# Patient Record
Sex: Female | Born: 1955
Health system: Southern US, Community
[De-identification: ages and names within clinical notes are randomized; demographics above are authoritative.]

## PROBLEM LIST (undated history)

## (undated) DIAGNOSIS — R7303 Prediabetes: Secondary | ICD-10-CM

## (undated) DIAGNOSIS — E7800 Pure hypercholesterolemia, unspecified: Secondary | ICD-10-CM

---

## 2006-03-15 ENCOUNTER — Other Ambulatory Visit: Admission: RE | Admit: 2006-03-15 | Discharge: 2006-03-15 | Payer: Self-pay | Admitting: Gynecology

## 2006-03-20 ENCOUNTER — Ambulatory Visit (HOSPITAL_COMMUNITY): Admission: RE | Admit: 2006-03-20 | Discharge: 2006-03-20 | Payer: Self-pay | Admitting: Gynecology

## 2006-03-30 ENCOUNTER — Encounter: Admission: RE | Admit: 2006-03-30 | Discharge: 2006-03-30 | Payer: Self-pay | Admitting: Gynecology

## 2006-10-03 ENCOUNTER — Encounter: Admission: RE | Admit: 2006-10-03 | Discharge: 2006-10-03 | Payer: Self-pay | Admitting: Family Medicine

## 2007-01-09 ENCOUNTER — Ambulatory Visit (HOSPITAL_BASED_OUTPATIENT_CLINIC_OR_DEPARTMENT_OTHER): Admission: RE | Admit: 2007-01-09 | Discharge: 2007-01-09 | Payer: Self-pay | Admitting: Urology

## 2007-11-07 ENCOUNTER — Ambulatory Visit: Payer: Self-pay

## 2009-02-19 ENCOUNTER — Ambulatory Visit: Payer: Self-pay | Admitting: Family Medicine

## 2009-03-05 ENCOUNTER — Ambulatory Visit: Payer: Self-pay | Admitting: Family Medicine

## 2009-09-07 ENCOUNTER — Emergency Department: Payer: Self-pay | Admitting: Emergency Medicine

## 2010-02-24 IMAGING — MG MAM DGTL ADD VW LT  SCR
2 series · 8 of 8 positions shown · non-contrast
Comparison: none

REASON FOR EXAM: left breast nodular density
COMMENTS:

[L ML · left · 4 of 4 slices shown]
[im 1/4]
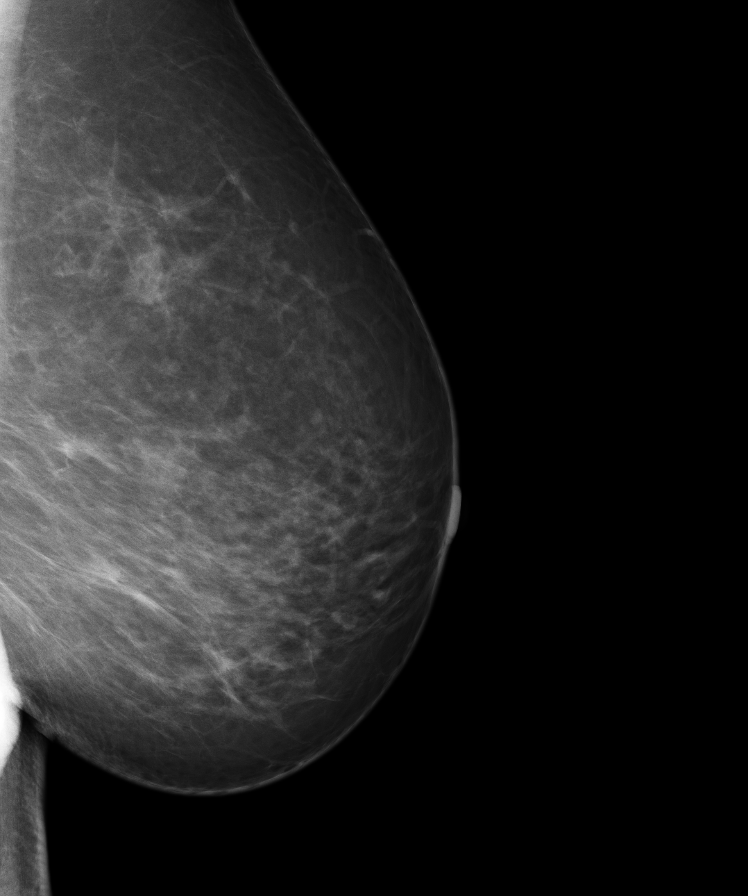
[im 2/4]
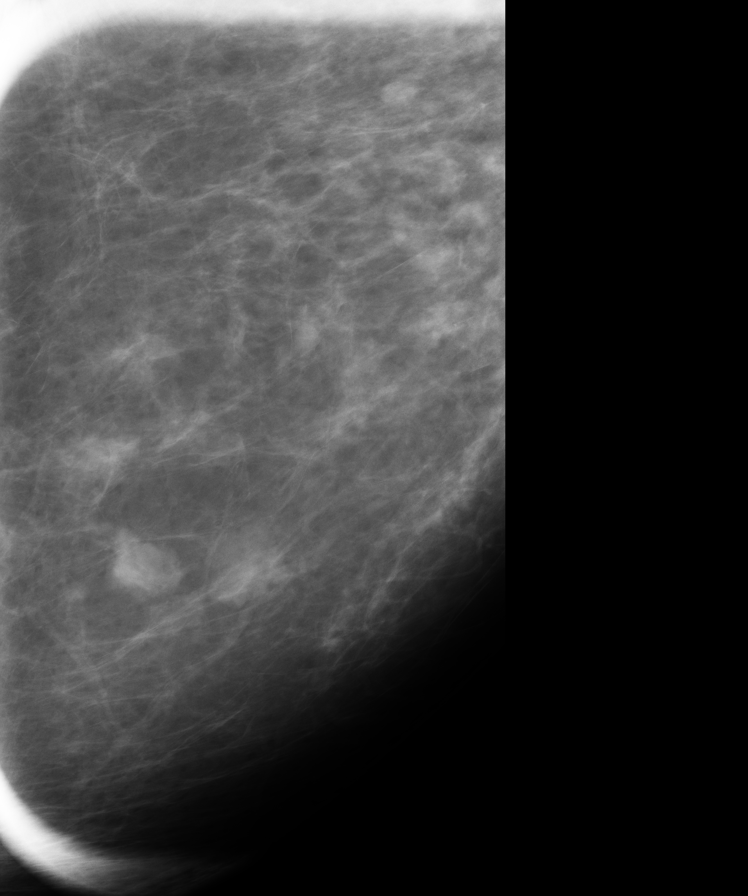
[im 3/4]
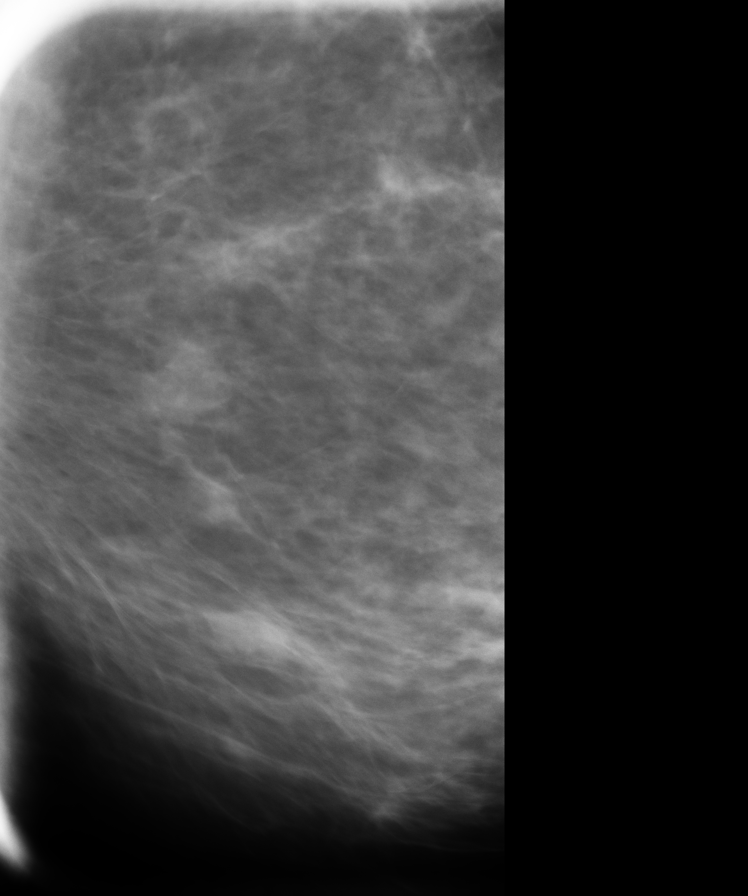
[im 4/4]
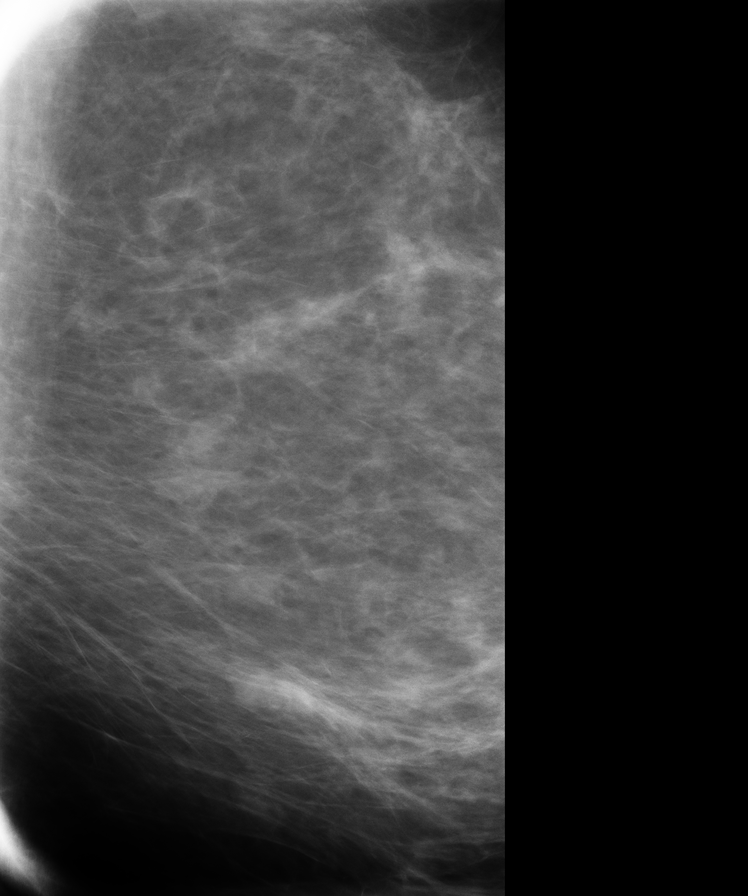

[L CC · left · 4 of 4 slices shown]
[im 1/4]
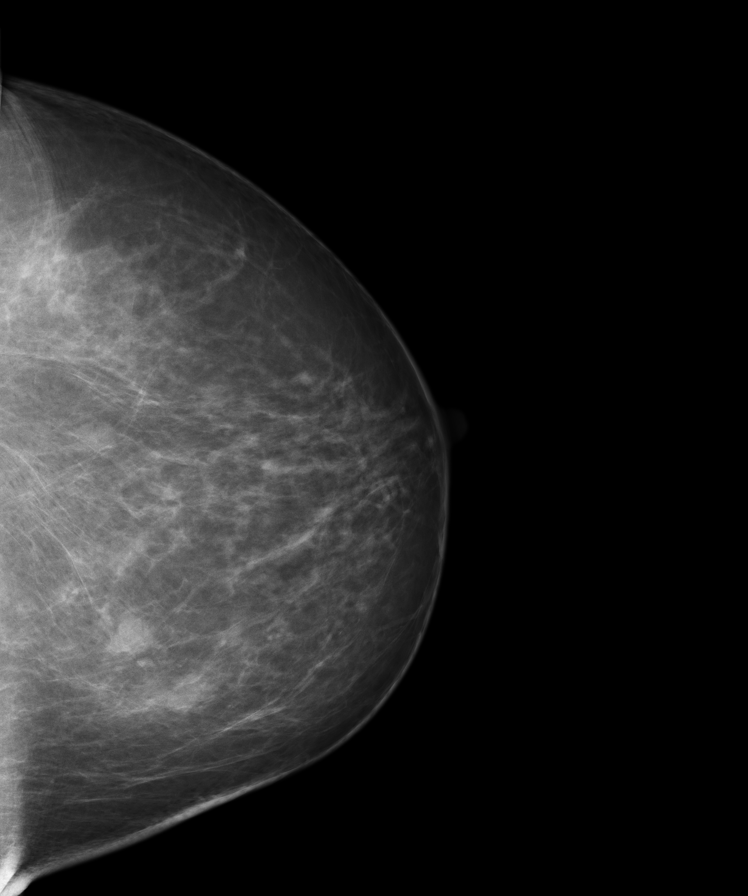
[im 2/4]
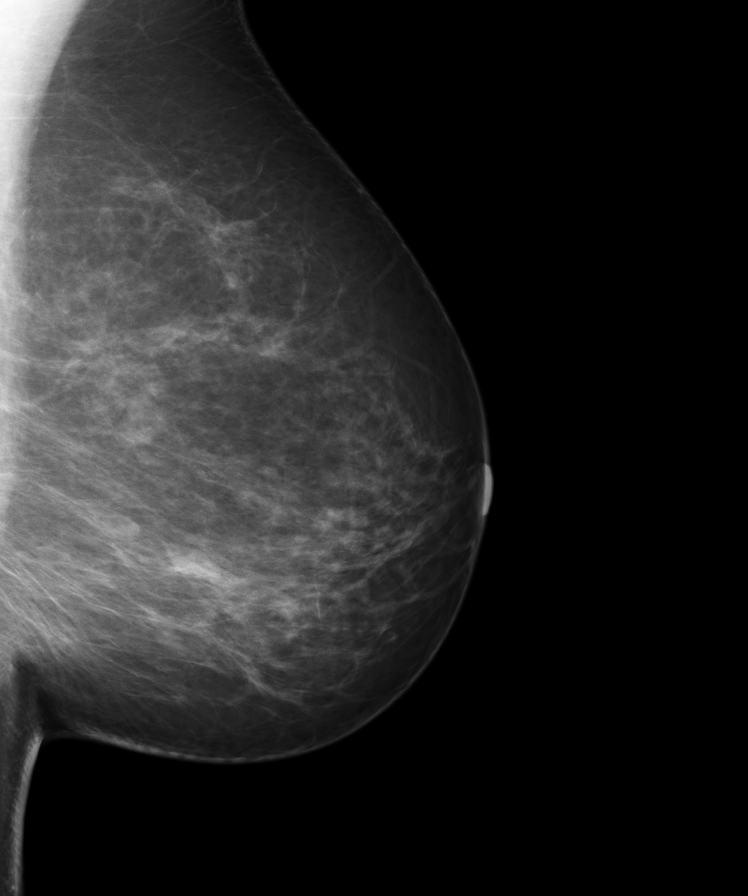
[im 3/4]
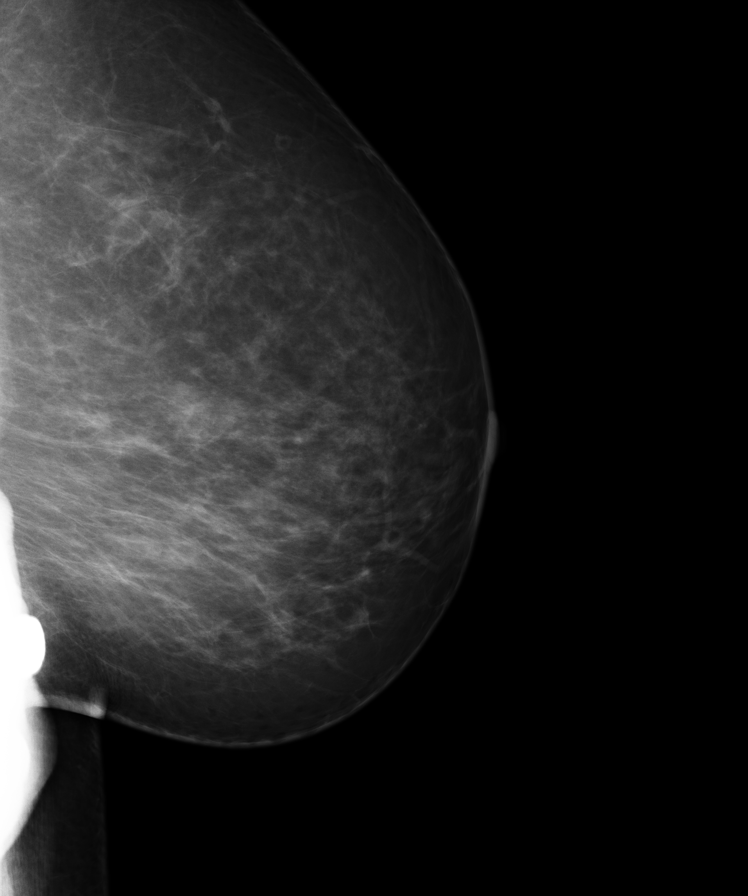
[im 4/4]
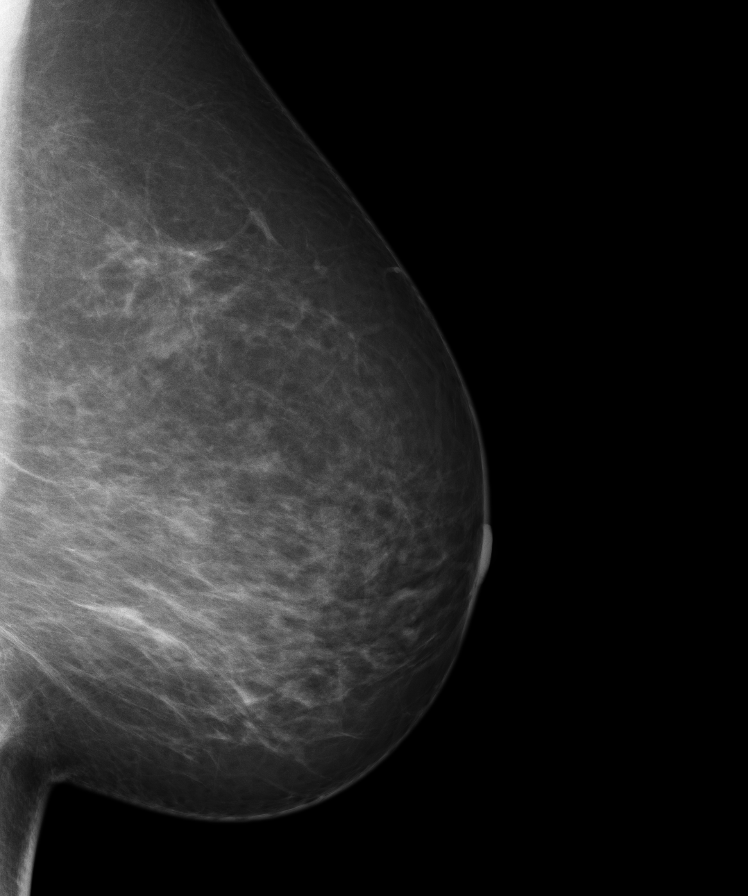

[8 of 8 positions shown; findings below may reference images not displayed]

PROCEDURE:     MAM - MAM DGTL ADD VW LT  SCR  - March 05, 2009  [DATE]

RESULT:     The asymmetric density observed in the medial aspect of the left
breast tends to spread on the compression views and is not definitely
identified in the ML view.  Rotated CC views and a repeat MLO view also show
the finding to be less prominent. No actual mass is identified. To document
stability it is recommended that the patient have a follow-up left mammogram
in six months and if no additional change is seen, then bilateral mammogram
or screening mammography at one year.
IMPRESSION: 1.Multiple additional views of the left breast were obtained. No mass is
identified at the site of the asymmetric density noted on the mammogram of
02-19-09.

2.It is recommended the patient have a follow-up left mammogram in six
months.

BI-RADS: Category 3 - Probably Benign Finding (interval follow-up)

Probably benign but with six months follow-up recommended.

A NEGATIVE MAMMOGRAM REPORT DOES NOT PRECLUDE BIOPSY OR OTHER EVALUATION OF
A CLINICALLY PALPABLE OR OTHERWISE SUSPICIOUS MASS OR LESION. BREAST CANCER
MAY NOT BE DETECTED BY MAMMOGRAPHY IN UP TO 10% OF CASES.

## 2011-10-10 ENCOUNTER — Ambulatory Visit: Payer: Self-pay | Admitting: Physician Assistant

## 2012-12-31 ENCOUNTER — Ambulatory Visit: Payer: Self-pay | Admitting: Physician Assistant

## 2014-01-06 ENCOUNTER — Ambulatory Visit: Payer: Self-pay | Admitting: Specialist

## 2014-01-22 ENCOUNTER — Encounter: Payer: Self-pay | Admitting: Specialist

## 2014-02-02 ENCOUNTER — Encounter: Payer: Self-pay | Admitting: Specialist

## 2014-03-04 ENCOUNTER — Encounter: Payer: Self-pay | Admitting: Specialist

## 2014-03-18 ENCOUNTER — Ambulatory Visit: Payer: Self-pay | Admitting: Physician Assistant

## 2014-03-19 ENCOUNTER — Ambulatory Visit: Payer: Self-pay | Admitting: Physician Assistant

## 2014-04-02 ENCOUNTER — Ambulatory Visit: Payer: Self-pay | Admitting: Physician Assistant

## 2014-04-02 HISTORY — PX: BREAST BIOPSY: SHX20

## 2014-04-03 LAB — PATHOLOGY REPORT

## 2014-11-25 DIAGNOSIS — R001 Bradycardia, unspecified: Secondary | ICD-10-CM | POA: Insufficient documentation

## 2014-11-25 DIAGNOSIS — R0789 Other chest pain: Secondary | ICD-10-CM | POA: Insufficient documentation

## 2014-11-25 DIAGNOSIS — Z87898 Personal history of other specified conditions: Secondary | ICD-10-CM | POA: Insufficient documentation

## 2014-12-04 HISTORY — PX: OTHER SURGICAL HISTORY: SHX169

## 2014-12-10 ENCOUNTER — Ambulatory Visit
Admit: 2014-12-10 | Disposition: A | Discharge status: Home / Self Care | Payer: Self-pay | Attending: Physician Assistant | Admitting: Physician Assistant

## 2015-12-29 ENCOUNTER — Ambulatory Visit: Payer: Self-pay | Admitting: Sports Medicine

## 2016-01-14 ENCOUNTER — Encounter: Payer: Self-pay | Admitting: Sports Medicine

## 2016-01-14 ENCOUNTER — Ambulatory Visit (INDEPENDENT_AMBULATORY_CARE_PROVIDER_SITE_OTHER): Payer: BLUE CROSS/BLUE SHIELD

## 2016-01-14 ENCOUNTER — Ambulatory Visit (INDEPENDENT_AMBULATORY_CARE_PROVIDER_SITE_OTHER): Payer: BLUE CROSS/BLUE SHIELD | Admitting: Sports Medicine

## 2016-01-14 DIAGNOSIS — M79673 Pain in unspecified foot: Secondary | ICD-10-CM

## 2016-01-14 DIAGNOSIS — M722 Plantar fascial fibromatosis: Secondary | ICD-10-CM | POA: Diagnosis not present

## 2016-01-14 NOTE — Patient Instructions (Signed)
Plantar Fasciitis With Rehab The plantar fascia is a fibrous, ligament-like, soft-tissue structure that spans the bottom of the foot. Plantar fasciitis, also called heel spur syndrome, is a condition that causes pain in the foot due to inflammation of the tissue. SYMPTOMS   Pain and tenderness on the underneath side of the foot.  Pain that worsens with standing or walking. CAUSES  Plantar fasciitis is caused by irritation and injury to the plantar fascia on the underneath side of the foot. Common mechanisms of injury include:  Direct trauma to bottom of the foot.  Damage to a small nerve that runs under the foot where the main fascia attaches to the heel bone.  Stress placed on the plantar fascia due to bone spurs. RISK INCREASES WITH:   Activities that place stress on the plantar fascia (running, jumping, pivoting, or cutting).  Poor strength and flexibility.  Improperly fitted shoes.  Tight calf muscles.  Flat feet.  Failure to warm-up properly before activity.  Obesity. PREVENTION  Warm up and stretch properly before activity.  Allow for adequate recovery between workouts.  Maintain physical fitness:  Strength, flexibility, and endurance.  Cardiovascular fitness.  Maintain a health body weight.  Avoid stress on the plantar fascia.  Wear properly fitted shoes, including arch supports for individuals who have flat feet. PROGNOSIS  If treated properly, then the symptoms of plantar fasciitis usually resolve without surgery. However, occasionally surgery is necessary. RELATED COMPLICATIONS   Recurrent symptoms that may result in a chronic condition.  Problems of the lower back that are caused by compensating for the injury, such as limping.  Pain or weakness of the foot during push-off following surgery.  Chronic inflammation, scarring, and partial or complete fascia tear, occurring more often from repeated injections. TREATMENT  Treatment initially involves  the use of ice and medication to help reduce pain and inflammation. The use of strengthening and stretching exercises may help reduce pain with activity, especially stretches of the Achilles tendon. These exercises may be performed at home or with a therapist. Your caregiver may recommend that you use heel cups of arch supports to help reduce stress on the plantar fascia. Occasionally, corticosteroid injections are given to reduce inflammation. If symptoms persist for greater than 6 months despite non-surgical (conservative), then surgery may be recommended.  MEDICATION   If pain medication is necessary, then nonsteroidal anti-inflammatory medications, such as aspirin and ibuprofen, or other minor pain relievers, such as acetaminophen, are often recommended.  Do not take pain medication within 7 days before surgery.  Prescription pain relievers may be given if deemed necessary by your caregiver. Use only as directed and only as much as you need.  Corticosteroid injections may be given by your caregiver. These injections should be reserved for the most serious cases, because they may only be given a certain number of times. HEAT AND COLD  Cold treatment (icing) relieves pain and reduces inflammation. Cold treatment should be applied for 10 to 15 minutes every 2 to 3 hours for inflammation and pain and immediately after any activity that aggravates your symptoms. Use ice packs or massage the area with a piece of ice (ice massage).  Heat treatment may be used prior to performing the stretching and strengthening activities prescribed by your caregiver, physical therapist, or athletic trainer. Use a heat pack or soak the injury in warm water. SEEK IMMEDIATE MEDICAL CARE IF:  Treatment seems to offer no benefit, or the condition worsens.  Any medications produce adverse side effects.   EXERCISES RANGE OF MOTION (ROM) AND STRETCHING EXERCISES - Plantar Fasciitis (Heel Spur Syndrome) These exercises may  help you when beginning to rehabilitate your injury. Your symptoms may resolve with or without further involvement from your physician, physical therapist or athletic trainer. While completing these exercises, remember:   Restoring tissue flexibility helps normal motion to return to the joints. This allows healthier, less painful movement and activity.  An effective stretch should be held for at least 30 seconds.  A stretch should never be painful. You should only feel a gentle lengthening or release in the stretched tissue. RANGE OF MOTION - Toe Extension, Flexion  Sit with your right / left leg crossed over your opposite knee.  Grasp your toes and gently pull them back toward the top of your foot. You should feel a stretch on the bottom of your toes and/or foot.  Hold this stretch for __________ seconds.  Now, gently pull your toes toward the bottom of your foot. You should feel a stretch on the top of your toes and or foot.  Hold this stretch for __________ seconds. Repeat __________ times. Complete this stretch __________ times per day.  RANGE OF MOTION - Ankle Dorsiflexion, Active Assisted  Remove shoes and sit on a chair that is preferably not on a carpeted surface.  Place right / left foot under knee. Extend your opposite leg for support.  Keeping your heel down, slide your right / left foot back toward the chair until you feel a stretch at your ankle or calf. If you do not feel a stretch, slide your bottom forward to the edge of the chair, while still keeping your heel down.  Hold this stretch for __________ seconds. Repeat __________ times. Complete this stretch __________ times per day.  STRETCH - Gastroc, Standing  Place hands on wall.  Extend right / left leg, keeping the front knee somewhat bent.  Slightly point your toes inward on your back foot.  Keeping your right / left heel on the floor and your knee straight, shift your weight toward the wall, not allowing your  back to arch.  You should feel a gentle stretch in the right / left calf. Hold this position for __________ seconds. Repeat __________ times. Complete this stretch __________ times per day. STRETCH - Soleus, Standing  Place hands on wall.  Extend right / left leg, keeping the other knee somewhat bent.  Slightly point your toes inward on your back foot.  Keep your right / left heel on the floor, bend your back knee, and slightly shift your weight over the back leg so that you feel a gentle stretch deep in your back calf.  Hold this position for __________ seconds. Repeat __________ times. Complete this stretch __________ times per day. STRETCH - Gastrocsoleus, Standing  Note: This exercise can place a lot of stress on your foot and ankle. Please complete this exercise only if specifically instructed by your caregiver.   Place the ball of your right / left foot on a step, keeping your other foot firmly on the same step.  Hold on to the wall or a rail for balance.  Slowly lift your other foot, allowing your body weight to press your heel down over the edge of the step.  You should feel a stretch in your right / left calf.  Hold this position for __________ seconds.  Repeat this exercise with a slight bend in your right / left knee. Repeat __________ times. Complete this stretch __________ times   per day.  STRENGTHENING EXERCISES - Plantar Fasciitis (Heel Spur Syndrome)  These exercises may help you when beginning to rehabilitate your injury. They may resolve your symptoms with or without further involvement from your physician, physical therapist or athletic trainer. While completing these exercises, remember:   Muscles can gain both the endurance and the strength needed for everyday activities through controlled exercises.  Complete these exercises as instructed by your physician, physical therapist or athletic trainer. Progress the resistance and repetitions only as  guided. STRENGTH - Towel Curls  Sit in a chair positioned on a non-carpeted surface.  Place your foot on a towel, keeping your heel on the floor.  Pull the towel toward your heel by only curling your toes. Keep your heel on the floor.  If instructed by your physician, physical therapist or athletic trainer, add ____________________ at the end of the towel. Repeat __________ times. Complete this exercise __________ times per day. STRENGTH - Ankle Inversion  Secure one end of a rubber exercise band/tubing to a fixed object (table, pole). Loop the other end around your foot just before your toes.  Place your fists between your knees. This will focus your strengthening at your ankle.  Slowly, pull your big toe up and in, making sure the band/tubing is positioned to resist the entire motion.  Hold this position for __________ seconds.  Have your muscles resist the band/tubing as it slowly pulls your foot back to the starting position. Repeat __________ times. Complete this exercises __________ times per day.    This information is not intended to replace advice given to you by your health care provider. Make sure you discuss any questions you have with your health care provider.   Document Released: 08/21/2005 Document Revised: 01/05/2015 Document Reviewed: 12/03/2008 Elsevier Interactive Patient Education 2016 Elsevier Inc.  Fascitis plantar con rehabilitacin (Plantar Fasciitis With Rehab) La fascia plantar es una estructura fibrosa, tipo ligamento, de tejido blando que abarca la parte inferior del pie. La fascitis plantar es una enfermedad que ocasiona dolor en el pie debido a la inflamacin del tejido. SNTOMAS  Dolor y sensibilidad en la planta del pie.  Dolor especialmente al ponerse de pie o caminar. CAUSAS La fascitis plantar est causada por irritacin y lesin en la fascia plantar debajo del pie. Los mecanismos ms frecuentes de una lesin son:  Golpe directo en la  planta del pie.  Dao a un pequeo nervio que [employer], Mudlogger en que la fascia principal se une al hueso del taln.  Estrs aplicado en la fascia plantar debido a espolones seos. EL RIESGO AUMENTA CON:   Actividades que estresan la fascia plantar (correr, saltar, pivotar o cortar).  Poca fuerza y flexibilidad.  Calzado mal ajustado.  Msculos de la pantorrilla tensos.  Pie plano.  No hacer un precalentamiento adecuado.  Obesidad. PREVENCIN  Precalentamiento adecuado y elongacin antes de la [place].  Descanso y recuperacin entre actividades.  Mantener la forma fsica:  Kerry Hough, flexibilidad y resistencia muscular.  Capacidad cardiovascular.  Mantenga un peso corporal adecuado.  Evite el estrs en la fascia plantar.  Para deportistas con pie plano, utilizacin de plantillas anatmicas para los arcos. PRONSTICO Si se trata adecuadamente, generalmente es curable sin [place]. En ocasiones requiere someterse a [place]. POSIBLES COMPLICACIONES  La recurrencia frecuente de los sntomas puede dar como resultado un problema crnico.  Problemas en la cintura causados para compensar la lesin, como renguera.  Dolor o debilidad en el pie al Passenger transport manager  pie luego de la [place].  Inflamacin crnica, cicatrizacin y ruptura parcial o completa de la fascia, que se produce luego de repetidas inyecciones. TRATAMIENTO El tratamiento inicial incluye el uso de medicamentos y la aplicacin de hielo para reducir Conservation officer, historic buildings y la inflamacin. Los ejercicios de elongacin y fortalecimiento pueden ayudar a reducir Conservation officer, historic buildings con la actividad, en especial los dirigidos al tendn de [place]. Los ejercicios pueden Press photographer o con un terapeuta. El [employer] podr recomendar que utilice tacos o soportes para el arco para ayudar a Software engineer de la fascia plantar. En algunos casos se indica una inyeccin de corticoides para reducir la inflamacin. Si los  sntomas persisten por ms de 6 meses de tratamiento no quirrgico (conservador), se Biochemist, clinical.  MEDICAMENTOS  Si necesita analgsicos, se recomiendan los antiinflamatorios no esteroides, como aspirina e ibuprofeno y otros calmantes menores, como acetaminofeno  No tome medicamentos para Conservation officer, historic buildings dentro de los 7 das previos a la [place].  Los analgsicos prescriptos se indicarn si el mdico lo considera necesario. Utilcelos como se le indique y slo cuando lo necesite.  En algunos casos se indica una inyeccin de corticosteroides. Estas inyecciones deben reservarse para los casos graves, porque slo se pueden administrar una determinada cantidad de veces. CALOR Y FRO  El tratamiento con fro [employer] y reduce la inflamacin. El fro debe aplicarse durante 10 a 15 minutos cada 2  3 horas para reducir la inflamacin y Conservation officer, historic buildings e inmediatamente despus de cualquier actividad que agrava los sntomas. Utilice bolsas de hielo o masajee la zona con un trozo de hielo (masaje de hielo).  El calor puede usarse antes de Neurosurgeon y [place] fortalecimiento indicadas por el profesional, le fisioterapeuta o Industrial/product designer. Utilice una bolsa trmica o sumerja la lesin en agua caliente. SOLICITE ATENCIN MDICA DE INMEDIATO SI:  El tratamiento no lo beneficia, o el trastorno empeora.  Los medicamentos producen efectos secundarios. EJERCICIOS EJERCICIOS DE AMPLITUD DE MOVIMIENTOS Y ELONGACIN - Fascitis plantar (sndrome del espoln en el taln) Estos ejercicios le ayudarn en la recuperacin de la lesin. Los sntomas podrn aliviarse con o sin asistencia adicional de su mdico, fisioterapeuta o Administrator, sports. Al completar estos ejercicios, recuerde:   Restaurar la flexibilidad del tejido ayuda a que las articulaciones recuperen el movimiento normal. Esto permite que el movimiento y la actividad sea ms saludables y menos dolorosos.  Para que sea efectiva, cada elongacin debe  realizarse durante al menos 30 segundos.  La elongacin nunca debe ser dolorosa. Deber sentir slo un alargamiento o distensin suave del tejido que estira. AMPLITUD DE MOVIMIENTOS - Extensin de los dedos - flexin  Sintese con la pierna derecha / izquierdo cruzada sobre la rodilla opuesta.  [employer] dedos de los pies y empjelos hacia usted. Debe sentir un estiramiento suave en la zona inferior de los dedos y del pie.  Mantenga esta posicin durante __________ segundos.  Luego, tome los dedos de los pies y empjelos [place]. Debe sentir un estiramiento suave en la zona superior de los dedos y del pie.  Mantenga esta posicin durante __________ segundos. Reptalo __________ veces. Realice este estiramiento __________ Vicenta Aly por da.  AMPLITUD DE MOVIMIENTOS - Dorsiflexin del tobillo - Rosealee Albee, asistida  Qutese los zapatos y sintese en una silla, preferiblemente en una superficie sin alfombra.  Coloque el pie derecha / izquierdo debajo de la rodilla. Extienda la pierna contraria para estar apoyado.  Con el taln [employer]  abajo, deslice el pie derecha / izquierdo hacia la silla hasta que sienta un estiramiento en el tobillo o pantorrilla. Si no lo siente, deslice la cadera hacia adelante [employer] borde de la silla, manteniendo el taln [place].  Mantenga esta posicin durante __________ segundos. Reptalo __________ veces. Realice este estiramiento __________ Vicenta Aly por da.  [place] manos en la pared.  Extienda la pierna derecha / izquierdo y Quarry manager la rodilla levemente flexionada.  Apunte los dedos ligeramente hacia adentro con el pie de atrs.  Mantenga el taln derecha / izquierdo en el suelo y la rodilla recta, cambie el peso hacia la pared y no permita que la espalda se arquee.  Debe sentir un estiramiento en la pantorrila derecha / izquierdo. Mantenga esta posicicin durante __________ segundos. Reptalo __________ veces. Realice  este estiramiento __________ Vicenta Aly por da. Tensed manos en la pared.  Extienda la pierna derecha / izquierdo y Quarry manager la rodilla levemente flexionada.  Apunte los dedos ligeramente hacia adentro con el pie de atrs.  Mantenga el taln derecha / izquierdo en el suelo, doble la rodilla de atrs y cambie suavemente el peso sobre la pierna de atrs hasta que sienta un ligero estiramiento en la pantorrilla de atrs.  Mantenga esta posicicin durante __________ segundos. Reptalo __________ veces. Realice este estiramiento __________ Vicenta Aly por da. ELONGACIN - Gastrocsoleus De pie Nota: Este ejercicio puede ser muy estresante para el pie y el tobillo. Realice los ejercicios slo en la forma indicada por el profesional que lo asiste.   Coloque la regin metatarsiana del pie derecha / izquierdo y Banker en el mismo escaln.  Si es necesario, sostngase de la pared o de la barandilla de una escalera para mantener el equilibrio.  Levante lentamente el [employer], y permita que el peso del cuerpo presione el taln sobre el borde del escaln.  Debe sentir un estiramiento en la pantorrilla derecha / izquierdo.  Mantenga esta posicicin durante __________ segundos.  Repita este ejercicio con la rodilla derecha / izquierdo levemente flexionada. Reptalo __________ veces. Realice este estiramiento __________ Vicenta Aly por da.  EJERCICIOS DE FORTALECIMIENTO - Fascitis plantar (sndrome del espoln en el taln) Estos ejercicios le ayudarn en la recuperacin de la lesin. Los sntomas podrn desaparecer con o sin mayor intervencin del profesional, el fisioterapeuta o Industrial/product designer. Al completar estos ejercicios, recuerde:   Los msculos pueden ganar tanto la resistencia como la fortaleza que necesita para sus actividades diarias a travs de ejercicios controlados.  Realice los ejercicios como se lo indic el mdico, el fisioterapeuta o Industrial/product designer. Aumente la  resistencia y las repeticiones segn se le haya indicado. [place] de toalla  Sintese en una silla en una superficie no alfombrada.  Coloque el pie en Rozann Lesches, y mantenga el taln en el suelo.  Coloque la toalla alrededor del taln pero envuelva slo los dedos del pie. Mantenga el taln contra el piso.  Aada ____________________ al extremo de la toalla si el mdico, fisioterapeuta o entrenador se lo indican. Reptalo __________ veces. Realice este ejercicio __________ veces por da. FUERZA - Inversin del tobillo  Asegure un extremo de una banda de goma para ejercicios a un objeto fijo (mesa, columna). Ate el extremo opuesto a su pie, justo antes de los dedos.  Coloque los puos [employer] rodillas. Esto har que la fuerza se concentre en el tobillo.  Lentamente, tire del dedo gordo [place]  y Johny Shears y asegrese de que la banda est posicionada de tal forma que puede resistir el movimiento completo.  Mantenga esta posicicin durante __________ segundos.  Haga que los msculos resistan la banda mientras tira lentamente el pie hacia atrs hasta la posicin inicial. Reptalo __________ veces. Realice este ejercicio __________ veces por da.    Esta informacin no tiene Marine scientist el consejo del mdico. Asegrese de hacerle al mdico cualquier pregunta que tenga.   Document Released: 06/07/2006 Document Revised: 01/05/2015 Elsevier Interactive Patient Education [employer].

## 2016-01-14 NOTE — Progress Notes (Signed)
Patient ID: Monica Woods, female   DOB: 1956/07/28, 60 y.o.   MRN: [id number] Subjective: Monica Woods is a 60 y.o. female patient presents to office for evaluation of bilateral heel pain. States that her heel pain was diagnosed in the past plantar fasciitis. States that over 12 years ago she was working and had an injury at work, which increased her pain to her right heel. Reports that after being treated and wearing custom orthotics. Her pain completely resolved however, occasionally now. She feels like her old orthotics are very worn and she is getting stabbing pain occasionally at worse at the end of her day that was better with custom orthotics. She tried store-bought orthotic that did not offer the same amount of relief thus patient is desiring new orthotics for this visit. Denies any other pedal complaints.   Patient Active Problem List   Diagnosis Date Noted  . Atypical chest pain 11/25/2014  . Personal history of other specified conditions 11/25/2014  . Bradycardia, sinus 11/25/2014    No current outpatient prescriptions on file prior to visit.   No current facility-administered medications on file prior to visit.    No Known Allergies  Objective: Physical Exam General: The patient is alert and oriented x3 in no acute distress.  Dermatology: Skin is warm, dry and supple bilateral lower extremities. Nails 1-10 are normal. There is no erythema, edema, no eccymosis, no open lesions present. Integument is otherwise unremarkable.  Vascular: Dorsalis Pedis pulse and Posterior Tibial pulse are 2/4 bilateral. Capillary fill time is immediate to all digits.  Neurological: Grossly intact to light touch with an achilles reflex of +2/5 and a  negative Tinel's sign bilateral.  Musculoskeletal: Minimal tenderness to palpation at the medial calcaneal tubercale and through the insertion of the plantar fascia on the right>left foot. No pain with compression of calcaneus bilateral. No pain  with tuning fork to calcaneus bilateral. No pain with calf compression bilateral. There is decreased Ankle joint range of motion bilateral. All other joints range of motion within normal limits bilateral. Mild bunion and hammertoe deformities. Strength 5/5 in all groups bilateral.   Xray, Right and Left foot:  Normal osseous mineralization. Joint spaces preserved. Mild bunion and hammertoe. No fracture/dislocation/boney destruction. Right greater than left Calcaneal spur present with mild thickening of plantar fascia. No other soft tissue abnormalities or radiopaque foreign bodies.   Assessment and Plan: Problem List Items Addressed This Visit    None    Visit Diagnoses    Foot pain, unspecified laterality    -  Primary    Relevant Orders    DG Foot 2 Views Left    DG Foot 2 Views Right    Bilateral plantar fasciitis          -Complete examination performed.  -Xrays reviewed -Discussed with patient in detail the condition of plantar fasciitis, how this occurs and general treatment options.  -Patient opts for new custom orthotics. Thus patient was scanned at today's visit and prescription was sent to [facility] lab for three quarter length Spenco orthotics with heel post -Explained and dispensed to patient daily stretching exercises to prevent recurrence or worsening of symptoms -Recommend patient to ice affected area 1-2x daily as needed -Patient to return to office to pick up orthotics or sooner if problems or questions arise.  Landis Martins, DPM

## 2016-01-28 ENCOUNTER — Encounter: Payer: Self-pay | Admitting: *Deleted

## 2016-02-11 ENCOUNTER — Ambulatory Visit: Payer: BLUE CROSS/BLUE SHIELD

## 2016-06-19 ENCOUNTER — Other Ambulatory Visit: Payer: Self-pay | Admitting: Physician Assistant

## 2016-06-19 DIAGNOSIS — Z1231 Encounter for screening mammogram for malignant neoplasm of breast: Secondary | ICD-10-CM

## 2016-08-21 ENCOUNTER — Ambulatory Visit
Admission: RE | Admit: 2016-08-21 | Discharge: 2016-08-21 | Disposition: A | Discharge status: Home / Self Care | Payer: BLUE CROSS/BLUE SHIELD | Source: Ambulatory Visit | Attending: Physician Assistant | Admitting: Physician Assistant

## 2016-08-21 DIAGNOSIS — Z1231 Encounter for screening mammogram for malignant neoplasm of breast: Secondary | ICD-10-CM | POA: Diagnosis not present

## 2017-07-24 ENCOUNTER — Other Ambulatory Visit: Payer: Self-pay | Admitting: Physician Assistant

## 2017-07-24 DIAGNOSIS — Z1231 Encounter for screening mammogram for malignant neoplasm of breast: Secondary | ICD-10-CM

## 2017-08-29 ENCOUNTER — Ambulatory Visit
Admission: RE | Admit: 2017-08-29 | Discharge: 2017-08-29 | Disposition: A | Discharge status: Home / Self Care | Payer: BLUE CROSS/BLUE SHIELD | Source: Ambulatory Visit | Attending: Physician Assistant | Admitting: Physician Assistant

## 2017-08-29 DIAGNOSIS — Z1231 Encounter for screening mammogram for malignant neoplasm of breast: Secondary | ICD-10-CM

## 2017-09-21 DIAGNOSIS — Z6822 Body mass index (BMI) 22.0-22.9, adult: Secondary | ICD-10-CM | POA: Diagnosis not present

## 2017-09-21 DIAGNOSIS — R42 Dizziness and giddiness: Secondary | ICD-10-CM | POA: Diagnosis not present

## 2017-09-21 DIAGNOSIS — F329 Major depressive disorder, single episode, unspecified: Secondary | ICD-10-CM | POA: Diagnosis not present

## 2017-09-21 DIAGNOSIS — E119 Type 2 diabetes mellitus without complications: Secondary | ICD-10-CM | POA: Diagnosis not present

## 2017-10-24 DIAGNOSIS — F329 Major depressive disorder, single episode, unspecified: Secondary | ICD-10-CM | POA: Diagnosis not present

## 2017-10-24 DIAGNOSIS — R42 Dizziness and giddiness: Secondary | ICD-10-CM | POA: Diagnosis not present

## 2017-10-24 DIAGNOSIS — Z1331 Encounter for screening for depression: Secondary | ICD-10-CM | POA: Diagnosis not present

## 2017-10-24 DIAGNOSIS — R413 Other amnesia: Secondary | ICD-10-CM | POA: Diagnosis not present

## 2017-10-24 DIAGNOSIS — M79605 Pain in left leg: Secondary | ICD-10-CM | POA: Diagnosis not present

## 2017-10-24 DIAGNOSIS — E119 Type 2 diabetes mellitus without complications: Secondary | ICD-10-CM | POA: Diagnosis not present

## 2017-12-19 DIAGNOSIS — H401122 Primary open-angle glaucoma, left eye, moderate stage: Secondary | ICD-10-CM | POA: Diagnosis not present

## 2017-12-19 DIAGNOSIS — H401111 Primary open-angle glaucoma, right eye, mild stage: Secondary | ICD-10-CM | POA: Diagnosis not present

## 2018-02-28 DIAGNOSIS — L80 Vitiligo: Secondary | ICD-10-CM | POA: Diagnosis not present

## 2018-02-28 DIAGNOSIS — M79605 Pain in left leg: Secondary | ICD-10-CM | POA: Diagnosis not present

## 2018-02-28 DIAGNOSIS — K760 Fatty (change of) liver, not elsewhere classified: Secondary | ICD-10-CM | POA: Diagnosis not present

## 2018-02-28 DIAGNOSIS — E119 Type 2 diabetes mellitus without complications: Secondary | ICD-10-CM | POA: Diagnosis not present

## 2018-07-03 ENCOUNTER — Other Ambulatory Visit: Payer: Self-pay | Admitting: Physician Assistant

## 2018-07-03 DIAGNOSIS — Z01419 Encounter for gynecological examination (general) (routine) without abnormal findings: Secondary | ICD-10-CM | POA: Diagnosis not present

## 2018-07-03 DIAGNOSIS — Z1231 Encounter for screening mammogram for malignant neoplasm of breast: Secondary | ICD-10-CM | POA: Diagnosis not present

## 2018-07-03 DIAGNOSIS — E119 Type 2 diabetes mellitus without complications: Secondary | ICD-10-CM | POA: Diagnosis not present

## 2018-07-03 DIAGNOSIS — Z6822 Body mass index (BMI) 22.0-22.9, adult: Secondary | ICD-10-CM | POA: Diagnosis not present

## 2018-07-03 DIAGNOSIS — R87610 Atypical squamous cells of undetermined significance on cytologic smear of cervix (ASC-US): Secondary | ICD-10-CM | POA: Diagnosis not present

## 2018-07-09 DIAGNOSIS — H401122 Primary open-angle glaucoma, left eye, moderate stage: Secondary | ICD-10-CM | POA: Diagnosis not present

## 2018-07-09 DIAGNOSIS — H02889 Meibomian gland dysfunction of unspecified eye, unspecified eyelid: Secondary | ICD-10-CM | POA: Diagnosis not present

## 2018-07-09 DIAGNOSIS — H401111 Primary open-angle glaucoma, right eye, mild stage: Secondary | ICD-10-CM | POA: Diagnosis not present

## 2018-07-17 DIAGNOSIS — R87610 Atypical squamous cells of undetermined significance on cytologic smear of cervix (ASC-US): Secondary | ICD-10-CM | POA: Diagnosis not present

## 2018-07-17 DIAGNOSIS — Z6822 Body mass index (BMI) 22.0-22.9, adult: Secondary | ICD-10-CM | POA: Diagnosis not present

## 2018-08-05 DIAGNOSIS — R87610 Atypical squamous cells of undetermined significance on cytologic smear of cervix (ASC-US): Secondary | ICD-10-CM | POA: Diagnosis not present

## 2018-08-09 DIAGNOSIS — R42 Dizziness and giddiness: Secondary | ICD-10-CM | POA: Diagnosis not present

## 2018-08-09 DIAGNOSIS — Z6824 Body mass index (BMI) 24.0-24.9, adult: Secondary | ICD-10-CM | POA: Diagnosis not present

## 2019-01-05 ENCOUNTER — Emergency Department
Admission: EM | Admit: 2019-01-05 | Discharge: 2019-01-06 | Disposition: A | Discharge status: Home / Self Care | Payer: BLUE CROSS/BLUE SHIELD | Attending: Pediatrics | Admitting: Pediatrics

## 2019-01-05 ENCOUNTER — Emergency Department: Payer: BLUE CROSS/BLUE SHIELD

## 2019-01-05 ENCOUNTER — Encounter: Payer: Self-pay | Admitting: Emergency Medicine

## 2019-01-05 ENCOUNTER — Other Ambulatory Visit: Payer: Self-pay

## 2019-01-05 DIAGNOSIS — R001 Bradycardia, unspecified: Secondary | ICD-10-CM | POA: Diagnosis not present

## 2019-01-05 DIAGNOSIS — R1012 Left upper quadrant pain: Secondary | ICD-10-CM | POA: Diagnosis not present

## 2019-01-05 DIAGNOSIS — R0789 Other chest pain: Secondary | ICD-10-CM | POA: Diagnosis not present

## 2019-01-05 DIAGNOSIS — K567 Ileus, unspecified: Secondary | ICD-10-CM | POA: Diagnosis not present

## 2019-01-05 HISTORY — DX: Pure hypercholesterolemia, unspecified: E78.00

## 2019-01-05 HISTORY — DX: Prediabetes: R73.03

## 2019-01-05 LAB — CBC
HCT: 38.8 % (ref 36.0–46.0)
Hemoglobin: 12.9 g/dL (ref 12.0–15.0)
MCH: 31.1 pg (ref 26.0–34.0)
MCHC: 33.2 g/dL (ref 30.0–36.0)
MCV: 93.5 fL (ref 80.0–100.0)
Platelets: 175 10*3/uL (ref 150–400)
RBC: 4.15 MIL/uL (ref 3.87–5.11)
RDW: 12.4 % (ref 11.5–15.5)
WBC: 6.4 10*3/uL (ref 4.0–10.5)
nRBC: 0 % (ref 0.0–0.2)

## 2019-01-05 LAB — HEPATIC FUNCTION PANEL
ALT: 27 U/L (ref 0–44)
AST: 23 U/L (ref 15–41)
Albumin: 4 g/dL (ref 3.5–5.0)
Alkaline Phosphatase: 54 U/L (ref 38–126)
Bilirubin, Direct: <0.1 mg/dL (ref 0.0–0.2)
Total Bilirubin: 1.2 mg/dL (ref 0.3–1.2)
Total Protein: 7.3 g/dL (ref 6.5–8.1)

## 2019-01-05 LAB — URINALYSIS, COMPLETE (UACMP) WITH MICROSCOPIC
Bacteria, UA: NONE SEEN
Bilirubin Urine: NEGATIVE
Glucose, UA: NEGATIVE mg/dL
Ketones, ur: NEGATIVE mg/dL
Leukocytes,Ua: NEGATIVE
Nitrite: NEGATIVE
Protein, ur: NEGATIVE mg/dL
Specific Gravity, Urine: 1.005 (ref 1.005–1.030)
pH: 7 (ref 5.0–8.0)

## 2019-01-05 LAB — BASIC METABOLIC PANEL
Anion gap: 10 (ref 5–15)
BUN: 28 mg/dL — ABNORMAL HIGH (ref 6–20)
CO2: 25 mmol/L (ref 22–32)
Calcium: 9.1 mg/dL (ref 8.9–10.3)
Chloride: 102 mmol/L (ref 98–111)
Creatinine, Ser: 0.62 mg/dL (ref 0.44–1.00)
GFR calc Af Amer: >60 mL/min (ref >60)
GFR calc non Af Amer: >60 mL/min (ref >60)
Glucose, Bld: 128 mg/dL — ABNORMAL HIGH (ref 70–99)
Potassium: 3.7 mmol/L (ref 3.5–5.1)
Sodium: 137 mmol/L (ref 135–145)

## 2019-01-05 LAB — LIPASE, BLOOD: Lipase: 48 U/L (ref 11–51)

## 2019-01-05 LAB — TROPONIN I: Troponin I: <0.03 ng/mL (ref <0.03)

## 2019-01-05 MED ORDER — SODIUM CHLORIDE 0.9% FLUSH: 3.0000 mL | Freq: Once | INTRAVENOUS | Status: DC

## 2019-01-05 MED ORDER — ALUM & MAG HYDROXIDE-SIMETH 200-200-20 MG/5ML PO SUSP
30.0000 mL | Freq: Once | ORAL | Status: AC
  Administered 2019-01-05: 30 mL via ORAL
  Filled 2019-01-05: qty 30

## 2019-01-05 MED ORDER — LIDOCAINE VISCOUS HCL 2 % MT SOLN
15.0000 mL | Freq: Once | OROMUCOSAL | Status: AC
  Administered 2019-01-05: 15 mL via ORAL
  Filled 2019-01-05: qty 15

## 2019-01-05 MED ORDER — IOHEXOL 300 MG/ML  SOLN
100.0000 mL | Freq: Once | INTRAMUSCULAR | Status: AC | PRN
  Administered 2019-01-05: 100 mL via INTRAVENOUS

## 2019-01-05 MED ORDER — FENTANYL CITRATE (PF) 100 MCG/2ML IJ SOLN
50.0000 ug | Freq: Once | INTRAMUSCULAR | Status: AC
  Administered 2019-01-06: 50 ug via INTRAVENOUS
  Filled 2019-01-05: qty 2

## 2019-01-05 NOTE — ED Notes (Signed)
Pt returned from [facility], interpreter paged

## 2019-01-05 NOTE — ED Triage Notes (Addendum)
Pt reports pain up under left rib cage area x 2 days; awaiting hospital interpreter for further triage;

## 2019-01-05 NOTE — ED Notes (Addendum)
ED Provider at bedside.  Pt daughter updated on telephone

## 2019-01-05 NOTE — ED Provider Notes (Signed)
[facility] Emergency Department Provider Note   ____________________________________________   I have reviewed the triage vital signs and the nursing notes.   HISTORY  Chief Complaint Chest Pain   History limited by: [place] utilized   HPI Monica Woods is a 63 y.o. female who presents to the emergency department today left lower chest pain.  She states that she has not had 3 episodes of this pain.  They started yesterday.  She states tonight has persisted.  It is somewhat sharp in nature.  She denies any pattern to what has brought the pain on.  She denies any associated shortness of breath.  No cough.  No nausea or vomiting.  She denies any recent change in bowel or bladder habits.  No fevers.  She denies similar pain in the past.    Records reviewed. Per medical record review patient has a history of HLD  Past Medical History:  Diagnosis Date  . High cholesterol   . Pre-diabetes     Patient Active Problem List   Diagnosis Date Noted  . Atypical chest pain 11/25/2014  . Personal history of other specified conditions 11/25/2014  . Bradycardia, sinus 11/25/2014    Past Surgical History:  Procedure Laterality Date  . BREAST BIOPSY Right 04/02/2014   neg stereo for FIBROADENOMATOUS CHANGES WITH SCLEROSIS AND STROMAL     Prior to Admission medications   Medication Sig Start Date End Date Taking? Authorizing Provider  Beta Carotene (VITAMIN A) 25000 UNIT capsule Take 8,000 Units by mouth daily.   Yes [provider]  folic acid (FOLVITE) 485 MCG tablet Take 400 mcg by mouth daily.   Yes [provider]  magnesium 30 MG tablet Take 250 mg by mouth daily.   Yes [provider]  omega-3 acid ethyl esters (LOVAZA) 1 g capsule Take by mouth daily.   Yes [provider]  vitamin E 400 UNIT capsule Take 400 Units by mouth daily.   Yes [provider]    Allergies Patient  has no known allergies.  Family History  Problem Relation Age of Onset  . Breast cancer Neg Hx     Social History Social History   Tobacco Use  . Smoking status: Never Smoker  . Smokeless tobacco: Never Used  Substance Use Topics  . Alcohol use: Never    Alcohol/week: 0.0 standard drinks    Frequency: Never  . Drug use: Never    Review of Systems Constitutional: No fever/chills Eyes: No visual changes. ENT: No sore throat. Cardiovascular: Positive for left lower chest pain Respiratory: Denies shortness of breath. Gastrointestinal: No abdominal pain.  No nausea, no vomiting.  No diarrhea.   Genitourinary: Negative for dysuria. Musculoskeletal: Negative for back pain. Skin: Negative for rash. Neurological: Negative for headaches, focal weakness or numbness.  ____________________________________________   PHYSICAL EXAM:  VITAL SIGNS: ED Triage Vitals  Enc Vitals Group     BP 01/05/19 2027 (!) 173/67     Pulse Rate 01/05/19 2027 (!) 58     Resp 01/05/19 2027 17     Temp 01/05/19 2027 98.2 F (36.8 C)     Temp Source 01/05/19 2027 Oral     SpO2 01/05/19 2027 100 %     Weight 01/05/19 2036 138 lb (62.6 kg)     Height 01/05/19 2036 5\' 4"  (1.626 m)     Head Circumference --      Peak Flow --      Pain  Score 01/05/19 2036 10   Constitutional: Alert and oriented.  Eyes: Conjunctivae are normal.  ENT      Head: Normocephalic and atraumatic.      Nose: No congestion/rhinnorhea.      Mouth/Throat: Mucous membranes are moist.      Neck: No stridor. Hematological/Lymphatic/Immunilogical: No cervical lymphadenopathy. Cardiovascular: Normal rate, regular rhythm.  No murmurs, rubs, or gallops.  Respiratory: Normal respiratory effort without tachypnea nor retractions. Breath sounds are clear and equal bilaterally. No wheezes/rales/rhonchi. Gastrointestinal: Soft and minimally tender in the left upper abdomen Genitourinary: Deferred Musculoskeletal: Normal range of motion  in all extremities. No lower extremity edema. Neurologic:  Normal speech and language. No gross focal neurologic deficits are appreciated.  Skin:  Skin is warm, dry and intact. No rash noted. Psychiatric: Mood and affect are normal. Speech and behavior are normal. Patient exhibits appropriate insight and judgment.  ____________________________________________    LABS (pertinent positives/negatives)  Trop <0.03 CBC wbc 6.4, hgb 12.9, plt 175 BMP wnl except glu 128, bun 28  ____________________________________________   EKG  I, Nance Pear, attending physician, personally viewed and interpreted this EKG  EKG Time: 2019 Rate: 57 Rhythm: sinus bradycardia Axis: normal Intervals: qtc 418 QRS: narrow ST changes: no st elevation Impression: abnormal ekg   ____________________________________________    RADIOLOGY  CXR NO acute findings  CT ab/pel pending at time of sign out ____________________________________________   PROCEDURES  Procedures  ____________________________________________   INITIAL IMPRESSION / ASSESSMENT AND PLAN / ED COURSE  Pertinent labs & imaging results that were available during my care of the patient were reviewed by me and considered in my medical decision making (see chart for details).   Patient presented to the emergency department today with a left lower chest pain.  On exam she did have some mild tenderness to the left upper abdomen.  Differential would be broad including ACS, pneumonia, pneumothorax, PE, dissection, pancreatitis, gastritis, diverticulitis, splenic pathology amongst other etiologies.  Patient's initial blood work and chest x-ray without concerning findings.  At this point I have lower suspicion for PE given lack of shortness of breath or pain.  Also have lower suspicion for dissection given nature clinical history of the pain.  Patient might of had some slight improvement of pain with GI cocktail.  However given  continuation of pain will obtain CT scan.  ___________________________________________   FINAL CLINICAL IMPRESSION(S) / ED DIAGNOSES  Left lower chest pain  Note: This dictation was prepared with Dragon dictation. Any transcriptional errors that result from this process are unintentional     Nance Pear, MD 01/05/19 2334

## 2019-01-05 NOTE — ED Notes (Signed)
Delay to give meds d/t EMS and rm 1 transfer

## 2019-01-05 NOTE — ED Notes (Signed)
Patient transported to X-ray 

## 2019-01-05 NOTE — ED Triage Notes (Addendum)
Pt says the pain started yesterday while she was at work; eased some in the afternoon but returned around 9pm; had difficulty sleeping last night due to pain but woke this am feeling better; was able to work today but the pain began to worsen around 630pm tonight; pt denies injury; denies cough/cold symptoms;

## 2019-01-06 MED ORDER — FENTANYL CITRATE (PF) 100 MCG/2ML IJ SOLN
50.0000 ug | Freq: Once | INTRAMUSCULAR | Status: AC
  Administered 2019-01-06: 01:00:00 50 ug via INTRAVENOUS
  Filled 2019-01-06: qty 2

## 2019-01-06 MED ORDER — MORPHINE SULFATE (PF) 4 MG/ML IV SOLN
4.0000 mg | Freq: Once | INTRAVENOUS | Status: AC
  Administered 2019-01-06: 4 mg via INTRAVENOUS
  Filled 2019-01-06: qty 1

## 2019-01-06 MED ORDER — ONDANSETRON 8 MG PO TBDP: 8.0000 mg | ORAL_TABLET | Freq: Three times a day (TID) | ORAL | 0 refills | Status: DC | PRN

## 2019-01-06 MED ORDER — OXYCODONE-ACETAMINOPHEN 5-325 MG PO TABS: 1.0000 | ORAL_TABLET | Freq: Four times a day (QID) | ORAL | 0 refills | Status: DC | PRN

## 2019-01-06 NOTE — ED Provider Notes (Signed)
-----------------------------------------   3:45 AM on 01/06/2019 -----------------------------------------  I took over care on this patient from Dr. Archie Balboa.  The patient presented with left upper quadrant abdominal/left lower chest pain.  CT was pending at the time of signout.  CT revealed some dilated bowel loops in the left upper quadrant consistent with ileus.  These could also suggest early SBO although there was no visible transition point.  On further history, the patient states that she has been passing gas normally.  She had a bowel movement earlier in the day which also was normal.  She has had no vomiting.  She has no history of abdominal surgeries.  I consulted Dr. Hampton Abbot from general surgery.  He reviewed the images and the clinical information.  He advises that the CT finding is most consistent with ileus given the appearance on CT, the patient's lack of risk factors for SBO, the fact that the stomach is not distended, and the reassuring lab work-up.  He advised that there is no indication for surgical intervention, and that the patient would be appropriate for discharge if her pain is controlled.  He also advised that if the patient required admission to the hospitalist for pain control he would be happy to follow.  The patient slept comfortably after her second dose of pain medication.  She then had some recurrence of pain.  I ordered a dose of morphine and she is now feeling better.  She has had no vomiting or other symptoms during the time I have been taking care of her.  I discussed the CT findings and the surgery recommendations with her.  I offered the patient admission to the hospitalist service for pain control and observation, however I explained that she also would be stable for discharge home if she preferred this.  The patient states that she would like to go home.  She is feeling much more comfortable now.  I had a thorough discussion with her about return precautions and  she expressed understanding.  She understands she should return at any time if her symptoms worsen.   Arta Silence, MD 01/06/19 [phone]

## 2019-01-06 NOTE — ED Notes (Signed)
Peripheral IV discontinued. Catheter intact. No signs of infiltration or redness. Gauze applied to IV site.   Discharge instructions reviewed with patient. Questions fielded by this RN. Patient verbalizes understanding of instructions. Patient discharged home in stable condition per Siadecki. No acute distress noted at time of discharge.

## 2019-01-06 NOTE — Discharge Instructions (Addendum)
Your CT scan shows some dilated or swollen loops of small intestine in your left upper abdomen.  This is most likely from an ileus, where the movement through your intestines slows down.  However, it can also be from an obstruction.  You can take the prescribed medicine as needed over the next few days if you still have some pain.  You should return to the ER immediately if you have new or worsening pain, vomiting, fever, or any other new or worsening symptoms that concern you.

## 2019-01-06 NOTE — ED Notes (Signed)
Pt, per interpreter, reports sharp stabbing pain to left upper quad, denies irregular BM or dysuria, pt reflects pain to light palpation, neg murphy's sign or rebound tenderness, pt denies any abdominal surg

## 2019-01-08 DIAGNOSIS — E119 Type 2 diabetes mellitus without complications: Secondary | ICD-10-CM | POA: Diagnosis not present

## 2019-01-08 DIAGNOSIS — K5900 Constipation, unspecified: Secondary | ICD-10-CM | POA: Diagnosis not present

## 2019-01-08 DIAGNOSIS — R1012 Left upper quadrant pain: Secondary | ICD-10-CM | POA: Diagnosis not present

## 2019-01-15 DIAGNOSIS — E119 Type 2 diabetes mellitus without complications: Secondary | ICD-10-CM | POA: Diagnosis not present

## 2019-01-15 DIAGNOSIS — Z1331 Encounter for screening for depression: Secondary | ICD-10-CM | POA: Diagnosis not present

## 2019-01-15 DIAGNOSIS — K5900 Constipation, unspecified: Secondary | ICD-10-CM | POA: Diagnosis not present

## 2019-01-15 DIAGNOSIS — B029 Zoster without complications: Secondary | ICD-10-CM | POA: Diagnosis not present

## 2019-01-15 DIAGNOSIS — K760 Fatty (change of) liver, not elsewhere classified: Secondary | ICD-10-CM | POA: Diagnosis not present

## 2019-01-30 DIAGNOSIS — I889 Nonspecific lymphadenitis, unspecified: Secondary | ICD-10-CM | POA: Diagnosis not present

## 2019-01-30 DIAGNOSIS — H9209 Otalgia, unspecified ear: Secondary | ICD-10-CM | POA: Diagnosis not present

## 2019-01-30 DIAGNOSIS — Z6823 Body mass index (BMI) 23.0-23.9, adult: Secondary | ICD-10-CM | POA: Diagnosis not present

## 2019-06-05 DIAGNOSIS — H02889 Meibomian gland dysfunction of unspecified eye, unspecified eyelid: Secondary | ICD-10-CM | POA: Diagnosis not present

## 2019-06-05 DIAGNOSIS — H401122 Primary open-angle glaucoma, left eye, moderate stage: Secondary | ICD-10-CM | POA: Diagnosis not present

## 2019-06-05 DIAGNOSIS — H43393 Other vitreous opacities, bilateral: Secondary | ICD-10-CM | POA: Diagnosis not present

## 2019-06-05 DIAGNOSIS — H401111 Primary open-angle glaucoma, right eye, mild stage: Secondary | ICD-10-CM | POA: Diagnosis not present

## 2019-06-05 HISTORY — PX: OTHER SURGICAL HISTORY: SHX169

## 2019-07-17 DIAGNOSIS — Z0000 Encounter for general adult medical examination without abnormal findings: Secondary | ICD-10-CM | POA: Diagnosis not present

## 2019-07-17 DIAGNOSIS — E119 Type 2 diabetes mellitus without complications: Secondary | ICD-10-CM | POA: Diagnosis not present

## 2019-07-17 DIAGNOSIS — Z1231 Encounter for screening mammogram for malignant neoplasm of breast: Secondary | ICD-10-CM | POA: Diagnosis not present

## 2019-07-17 DIAGNOSIS — Z13220 Encounter for screening for lipoid disorders: Secondary | ICD-10-CM | POA: Diagnosis not present

## 2019-07-17 DIAGNOSIS — M1711 Unilateral primary osteoarthritis, right knee: Secondary | ICD-10-CM | POA: Diagnosis not present

## 2019-08-26 DIAGNOSIS — Z1231 Encounter for screening mammogram for malignant neoplasm of breast: Secondary | ICD-10-CM | POA: Diagnosis not present

## 2019-08-26 DIAGNOSIS — Z1239 Encounter for other screening for malignant neoplasm of breast: Secondary | ICD-10-CM | POA: Diagnosis not present

## 2019-08-26 DIAGNOSIS — Z9289 Personal history of other medical treatment: Secondary | ICD-10-CM | POA: Diagnosis not present

## 2019-10-27 DIAGNOSIS — Z20822 Contact with and (suspected) exposure to covid-19: Secondary | ICD-10-CM | POA: Diagnosis not present

## 2019-11-06 DIAGNOSIS — H43811 Vitreous degeneration, right eye: Secondary | ICD-10-CM | POA: Diagnosis not present

## 2019-11-06 DIAGNOSIS — H43393 Other vitreous opacities, bilateral: Secondary | ICD-10-CM | POA: Diagnosis not present

## 2019-11-06 DIAGNOSIS — H401132 Primary open-angle glaucoma, bilateral, moderate stage: Secondary | ICD-10-CM | POA: Diagnosis not present

## 2019-11-06 DIAGNOSIS — H02889 Meibomian gland dysfunction of unspecified eye, unspecified eyelid: Secondary | ICD-10-CM | POA: Diagnosis not present

## 2019-12-27 IMAGING — CT CT ABDOMEN AND PELVIS WITH CONTRAST
2 of 5 series · 16 of 46 positions shown, 18 images · IV contrast (APPLIED)
Comparison: Ultrasound 12/10/2014

CLINICAL DATA: Left upper quadrant pain

EXAM:
CT ABDOMEN AND PELVIS WITH CONTRAST
TECHNIQUE: Multidetector CT imaging of the abdomen and pelvis was performed
using the standard protocol following bolus administration of
intravenous contrast.
CONTRAST:  100mL OMNIPAQUE IOHEXOL 300 MG/ML  SOLN

[Series 2: routine abd/pel with · axial · 0.71mm/px · z∈[-1238,-838]mm · 13 of 90 slices shown, 15 images]
[im 5/90  soft-tissue]
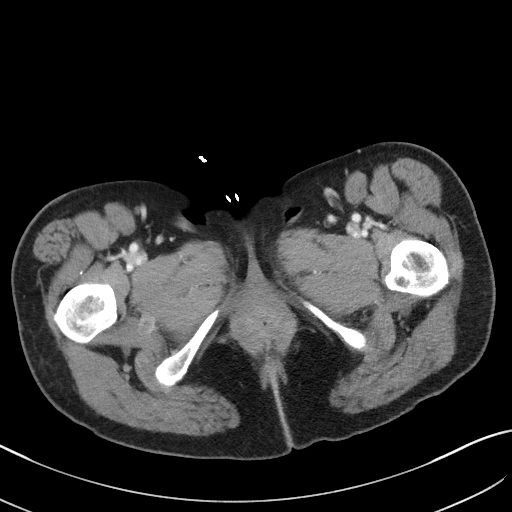
[im 5/90  bone]
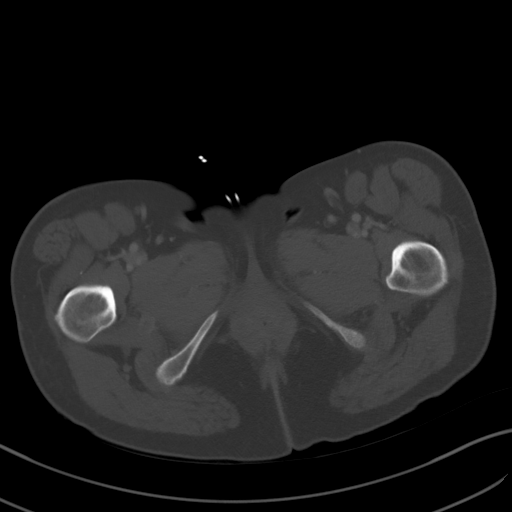
[im 10/90  soft-tissue]
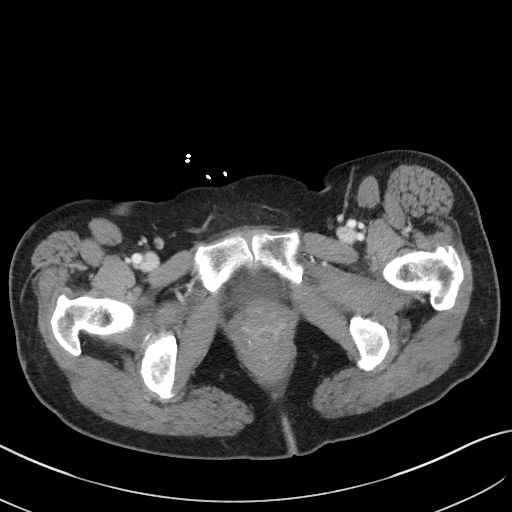
[im 20/90  soft-tissue]
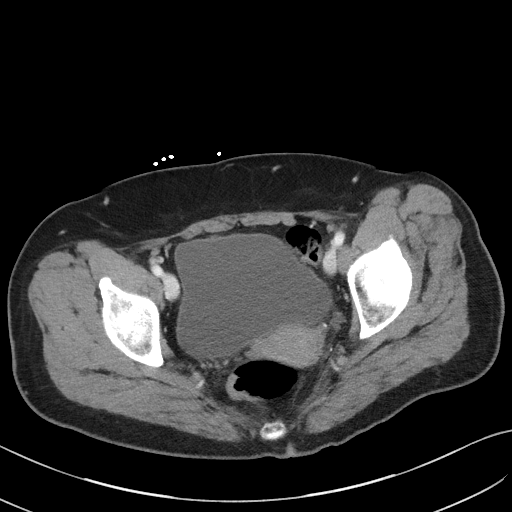
[im 25/90  soft-tissue]
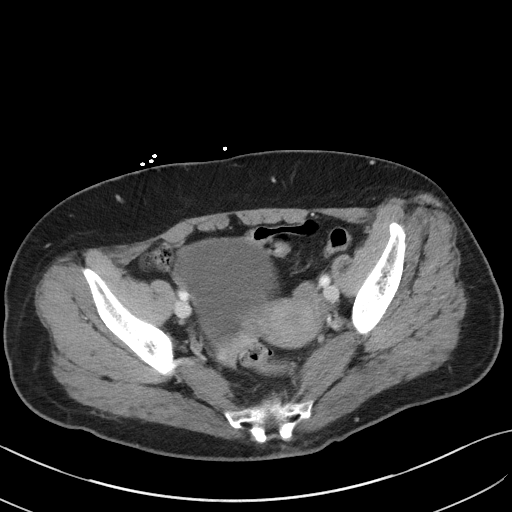
[im 30/90  soft-tissue]
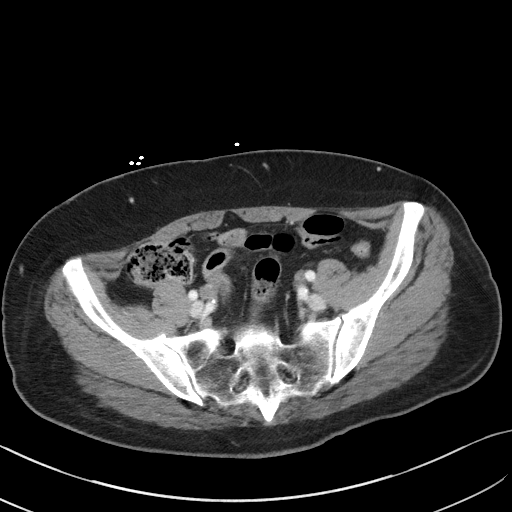
[im 40/90  soft-tissue]
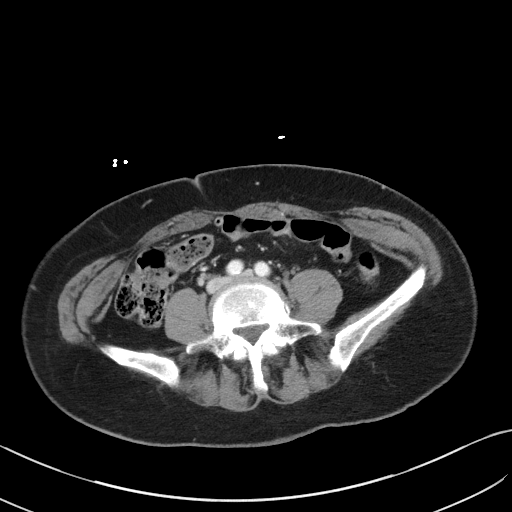
[im 45/90  soft-tissue]
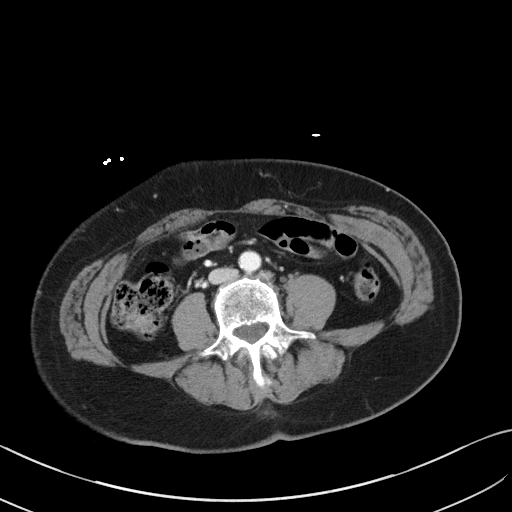
[im 50/90  soft-tissue]
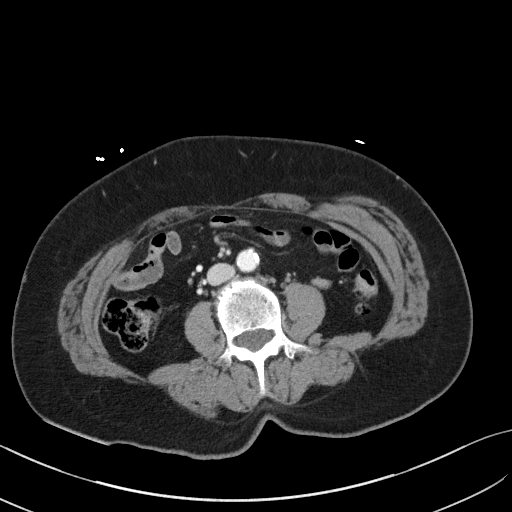
[im 60/90  soft-tissue]
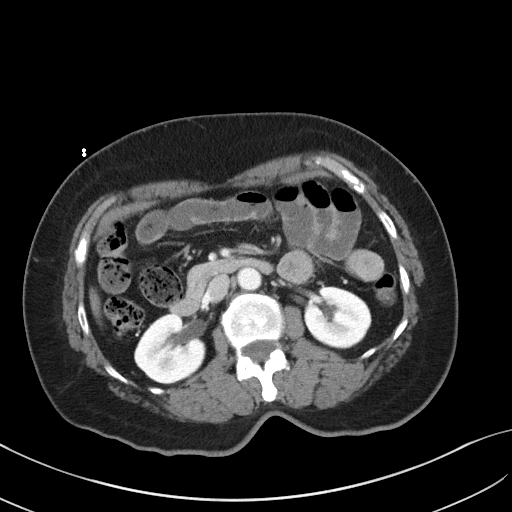
[im 60/90  bone]
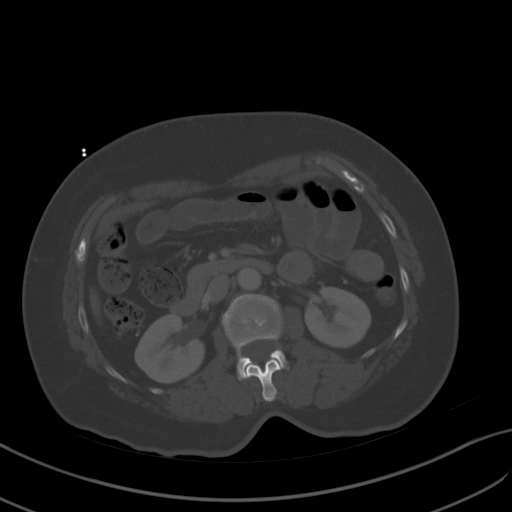
[im 65/90  soft-tissue]
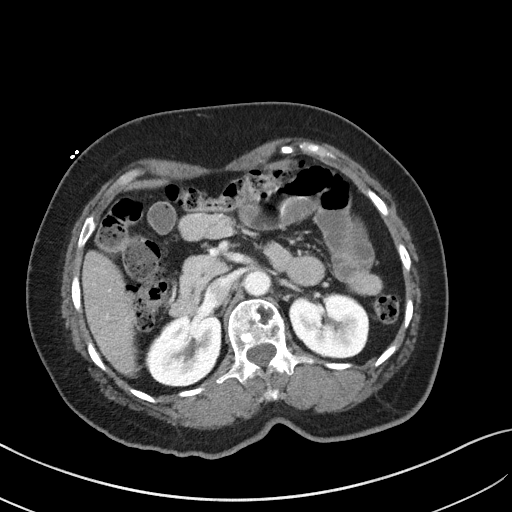
[im 70/90  soft-tissue]
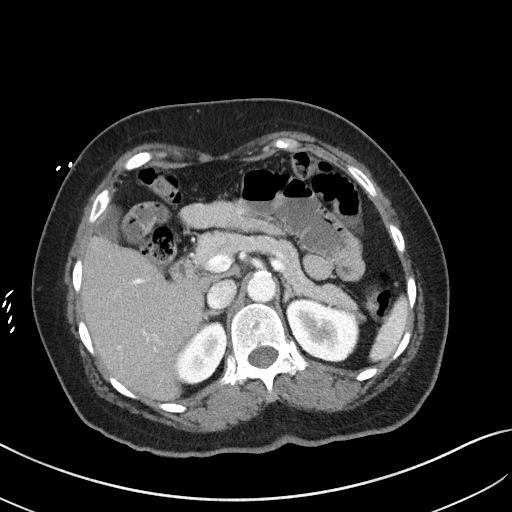
[im 80/90  soft-tissue]
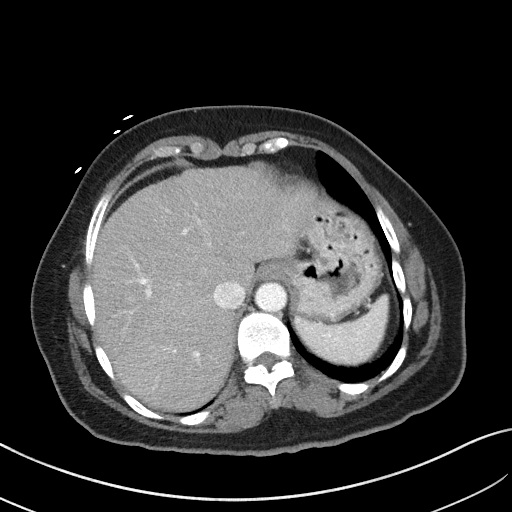
[im 85/90  soft-tissue]
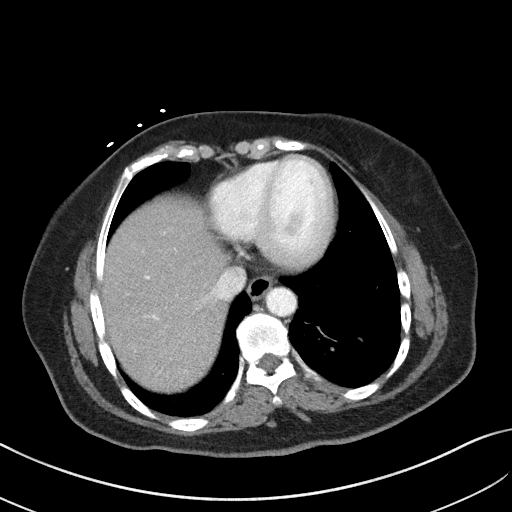

[Series 5: coronal st · coronal · 0.66mm/px · 3 of 85 slices shown]
[im 29/85  soft-tissue]
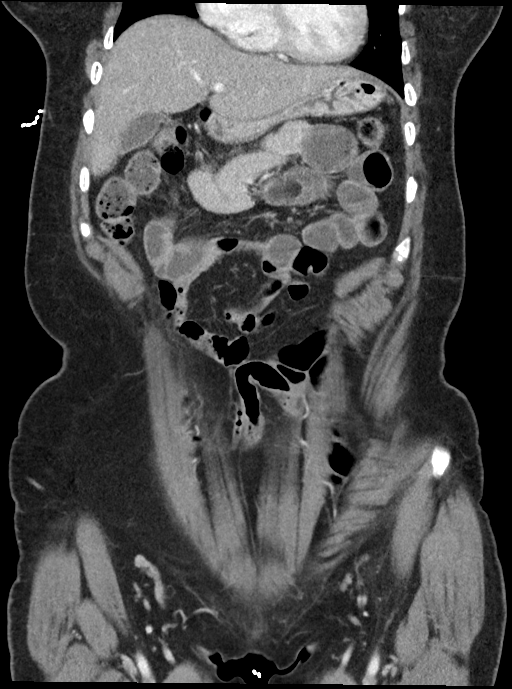
[im 38/85  soft-tissue]
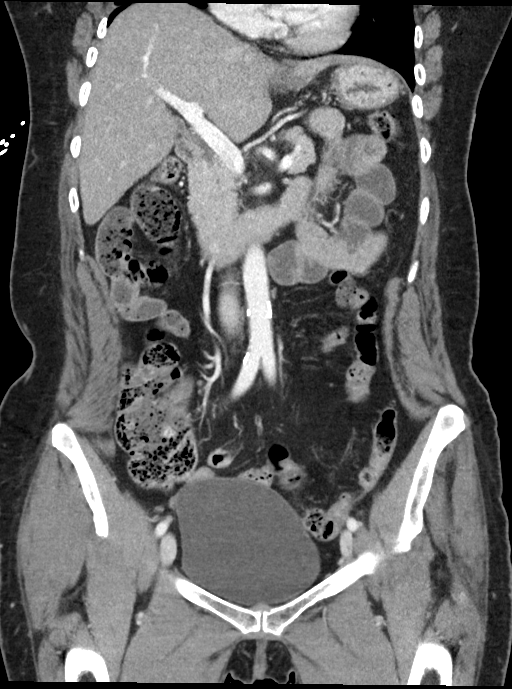
[im 47/85  soft-tissue]
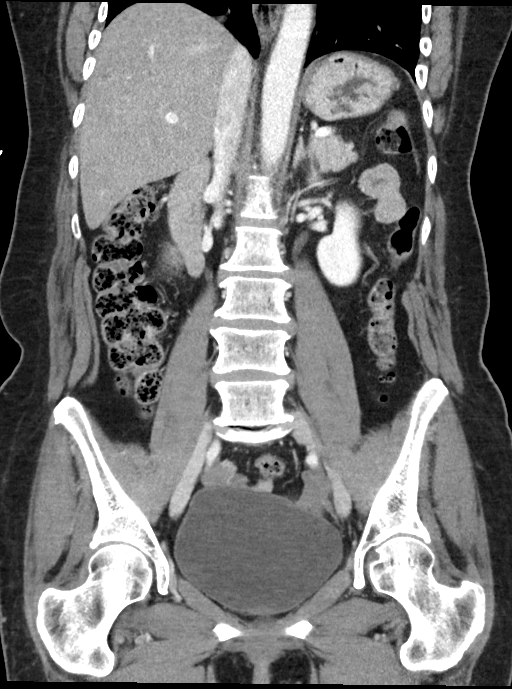

[16 of 46 positions shown; findings below may reference images not displayed]

FINDINGS: Lower chest: No acute abnormality.

Hepatobiliary: Diffuse low-density throughout the liver compatible
with fatty infiltration. No focal abnormality. Gallbladder
unremarkable.

Pancreas: No focal abnormality or ductal dilatation.

Spleen: No focal abnormality.  Normal size.

Adrenals/Urinary Tract: No adrenal abnormality. No focal renal
abnormality. No stones or hydronephrosis. Urinary bladder is
unremarkable.

Stomach/Bowel: Prominent left abdominal small bowel loops. Mid and
distal small bowel loops are decompressed. Findings could reflect
focal ileus or early small bowel obstruction. Exact cause or
transition not visualized. Moderate stool in the colon. Appendix
normal. Stomach decompressed, unremarkable.

Vascular/Lymphatic: Aortic atherosclerosis. No enlarged abdominal or
pelvic lymph nodes.

Reproductive: Uterus and adnexa unremarkable.  No mass.

Other: No free fluid or free air.

Musculoskeletal: No acute bony abnormality. Degenerative disc
disease in the lower lumbar spine.
IMPRESSION: Mildly prominent left upper abdominal small bowel loops. Exact
transition not visualized. This could reflect focal ileus or early
low grade small bowel obstruction.

Focal fatty infiltration of the liver.

Mild aortic atherosclerosis.

## 2020-01-19 DIAGNOSIS — R42 Dizziness and giddiness: Secondary | ICD-10-CM | POA: Diagnosis not present

## 2020-01-19 DIAGNOSIS — M1711 Unilateral primary osteoarthritis, right knee: Secondary | ICD-10-CM | POA: Diagnosis not present

## 2020-01-19 DIAGNOSIS — E119 Type 2 diabetes mellitus without complications: Secondary | ICD-10-CM | POA: Diagnosis not present

## 2020-03-17 DIAGNOSIS — H5213 Myopia, bilateral: Secondary | ICD-10-CM | POA: Diagnosis not present

## 2020-03-17 DIAGNOSIS — H401132 Primary open-angle glaucoma, bilateral, moderate stage: Secondary | ICD-10-CM | POA: Diagnosis not present

## 2020-03-17 DIAGNOSIS — H43393 Other vitreous opacities, bilateral: Secondary | ICD-10-CM | POA: Diagnosis not present

## 2020-04-20 DIAGNOSIS — K760 Fatty (change of) liver, not elsewhere classified: Secondary | ICD-10-CM | POA: Diagnosis not present

## 2020-04-20 DIAGNOSIS — E119 Type 2 diabetes mellitus without complications: Secondary | ICD-10-CM | POA: Diagnosis not present

## 2020-04-20 DIAGNOSIS — M1711 Unilateral primary osteoarthritis, right knee: Secondary | ICD-10-CM | POA: Diagnosis not present

## 2020-07-21 DIAGNOSIS — Z0000 Encounter for general adult medical examination without abnormal findings: Secondary | ICD-10-CM | POA: Diagnosis not present

## 2020-07-21 DIAGNOSIS — Z124 Encounter for screening for malignant neoplasm of cervix: Secondary | ICD-10-CM | POA: Diagnosis not present

## 2020-07-21 DIAGNOSIS — E119 Type 2 diabetes mellitus without complications: Secondary | ICD-10-CM | POA: Diagnosis not present

## 2020-07-21 DIAGNOSIS — Z1231 Encounter for screening mammogram for malignant neoplasm of breast: Secondary | ICD-10-CM | POA: Diagnosis not present

## 2020-07-27 DIAGNOSIS — I10 Essential (primary) hypertension: Secondary | ICD-10-CM | POA: Diagnosis not present

## 2020-07-27 DIAGNOSIS — H401112 Primary open-angle glaucoma, right eye, moderate stage: Secondary | ICD-10-CM | POA: Diagnosis not present

## 2020-07-27 DIAGNOSIS — H401123 Primary open-angle glaucoma, left eye, severe stage: Secondary | ICD-10-CM | POA: Diagnosis not present

## 2020-08-26 DIAGNOSIS — Z1231 Encounter for screening mammogram for malignant neoplasm of breast: Secondary | ICD-10-CM | POA: Diagnosis not present

## 2021-05-06 ENCOUNTER — Ambulatory Visit: Payer: No Typology Code available for payment source | Admitting: Podiatry

## 2021-05-06 ENCOUNTER — Ambulatory Visit (INDEPENDENT_AMBULATORY_CARE_PROVIDER_SITE_OTHER): Payer: No Typology Code available for payment source

## 2021-05-06 ENCOUNTER — Encounter: Payer: Self-pay | Admitting: *Deleted

## 2021-05-06 ENCOUNTER — Other Ambulatory Visit: Payer: Self-pay

## 2021-05-06 DIAGNOSIS — M722 Plantar fascial fibromatosis: Secondary | ICD-10-CM

## 2021-05-06 DIAGNOSIS — M79672 Pain in left foot: Secondary | ICD-10-CM | POA: Diagnosis not present

## 2021-05-06 MED ORDER — MELOXICAM 15 MG PO TABS: 15.0000 mg | ORAL_TABLET | Freq: Every day | ORAL | 1 refills | Status: DC

## 2021-05-22 DIAGNOSIS — M722 Plantar fascial fibromatosis: Secondary | ICD-10-CM | POA: Diagnosis not present

## 2021-05-22 MED ORDER — BETAMETHASONE SOD PHOS & ACET 6 (3-3) MG/ML IJ SUSP
3.0000 mg | Freq: Once | INTRAMUSCULAR | Status: AC
  Administered 2021-05-22: 3 mg via INTRA_ARTICULAR

## 2021-05-22 NOTE — Progress Notes (Signed)
   Subjective: 65 y.o. female presenting today as a reestablish new patient for evaluation of pain and tenderness to the left heel that is been going on for few months now.  Patient states that several years prior she was seen in 2017 and received an injection which helped significantly.  She also had custom molded orthotics made at that time.  She has been wearing them and they helped.  She presents for further treatment and evaluation   Past Medical History:  Diagnosis Date   High cholesterol    Pre-diabetes      Objective: Physical Exam General: The patient is alert and oriented x3 in no acute distress.  Dermatology: Skin is warm, dry and supple bilateral lower extremities. Negative for open lesions or macerations bilateral.   Vascular: Dorsalis Pedis and Posterior Tibial pulses palpable bilateral.  Capillary fill time is immediate to all digits.  Neurological: Epicritic and protective threshold intact bilateral.   Musculoskeletal: Tenderness to palpation to the plantar aspect of the left heel along the plantar fascia. All other joints range of motion within normal limits bilateral. Strength 5/5 in all groups bilateral.   Radiographic exam: Normal osseous mineralization. Joint spaces preserved. No fracture/dislocation/boney destruction. No other soft tissue abnormalities or radiopaque foreign bodies.   Assessment: 1. Plantar fasciitis left foot  Plan of Care:  1. Patient evaluated. Xrays reviewed.   2. Injection of 0.5cc Celestone soluspan injected into the left plantar fascia.  3.  Continue custom molded orthotics that the patient already has 4. Rx for Meloxicam ordered for patient. 5. Instructed patient regarding therapies and modalities at home to alleviate symptoms.  6. Return to clinic in 4 weeks.    *From [place]   Taquanna Borras M. Hamza Empson, DPM [facility]  Dr. Edrick Kins, DPM    [address], [place]  [address]                Office [phone]  Fax [phone]

## 2021-06-09 ENCOUNTER — Ambulatory Visit (INDEPENDENT_AMBULATORY_CARE_PROVIDER_SITE_OTHER): Payer: No Typology Code available for payment source

## 2021-06-09 ENCOUNTER — Ambulatory Visit: Payer: No Typology Code available for payment source | Admitting: Cardiology

## 2021-06-09 ENCOUNTER — Encounter: Payer: Self-pay | Admitting: Cardiology

## 2021-06-09 ENCOUNTER — Other Ambulatory Visit: Payer: Self-pay

## 2021-06-09 VITALS — BP 108/66 | HR 56 | Ht 64.0 in | Wt 146.0 lb

## 2021-06-09 DIAGNOSIS — R55 Syncope and collapse: Secondary | ICD-10-CM | POA: Diagnosis not present

## 2021-06-09 DIAGNOSIS — H538 Other visual disturbances: Secondary | ICD-10-CM | POA: Diagnosis not present

## 2021-06-09 DIAGNOSIS — R001 Bradycardia, unspecified: Secondary | ICD-10-CM

## 2021-06-09 DIAGNOSIS — R42 Dizziness and giddiness: Secondary | ICD-10-CM | POA: Diagnosis not present

## 2021-06-09 NOTE — Assessment & Plan Note (Signed)
Probably nothing, may very well be related to hypotension, but with her being concern for amaurosis fugax with her vision symptoms, we will check a carotid Dopplers to exclude stenosis or even potential dissection

## 2021-06-09 NOTE — Assessment & Plan Note (Signed)
Syncope episode which to me sounds like she had not been eating and drinking that day, then went to a party.  Is possible she felt lightheaded dizzy and may be potentially had some orthostatic symptoms.  Today, on exam she is bradycardic and the EKG shows sinus bradycardia as well.  She denies any symptoms of palpitations or irregular heartbeats.  Would allow for permissive hypertension.  Is not on blood pressure medication and she is borderline orthostatic.  I think that is probably the etiology.   Plan: Ensure adequate hydration of 80 to 100 mL a day.  Different socks provided.3  Standard syncope work-up:  Check 2D echo for structural abnormality.  14-day Zio patch monitor  With the symptom of blurred vision being concerning for amaurosis fugax, we will also check carotid Dopplers.  Follow-up after results.

## 2021-06-09 NOTE — Assessment & Plan Note (Signed)
I suspect this underlying potential finding is the etiology for her syncopal episode.  She probably had baseline low regular blood pressure and interestingly, this happened after eating, but she did not feel well after eating it is possible that she had a vagal episode triggered by the need for BM.  Post catharsis, she came out feeling okay.  Increase p.o. hydration with increased water, liquid IV etc. try to avoid Gatorade or Powerade due to excess glucose.  Reassess in follow-up. ->  If she has more symptoms, may consider further evaluation, and consider midodrine

## 2021-06-09 NOTE — Progress Notes (Signed)
Primary Care Provider: Cyndi Bender, PA-C [facility] Cardiologist: None Electrophysiologist: None  Clinic Note: Chief Complaint  Patient presents with   New Patient (Initial Visit)    Ref by Cyndi Bender, PA-C for near syncope. Medications reviewed by the patient verbally. Patient c/o near syncope and dizziness for the past two months.    Loss of Consciousness    Shortly after standing up from the car    ===================================  ASSESSMENT/PLAN   Problem List Items Addressed This Visit       Cardiology Problems   Bradycardia, sinus - Primary    She appears to have baseline bradycardia.  This was noted in her clinic visit with Cyndi Bender, PA.  Not so slow that it would cause syncope, but need to exclude any heart block or pauses.  Plan: Avoid AV nodal agents 14-day Zio patch monitor      Relevant Orders   EKG 12-Lead   LONG TERM MONITOR (3-14 DAYS)     Other   Syncope and collapse    Syncope episode which to me sounds like she had not been eating and drinking that day, then went to a party.  Is possible she felt lightheaded dizzy and may be potentially had some orthostatic symptoms.  Today, on exam she is bradycardic and the EKG shows sinus bradycardia as well.  She denies any symptoms of palpitations or irregular heartbeats.  Would allow for permissive hypertension.  Is not on blood pressure medication and she is borderline orthostatic.  I think that is probably the etiology.   Plan: Ensure adequate hydration of 80 to 100 mL a day.  Different socks provided.3 Standard syncope work-up: Check 2D echo for structural abnormality. 14-day Zio patch monitor With the symptom of blurred vision being concerning for amaurosis fugax, we will also check carotid Dopplers.  Follow-up after results.      Relevant Orders   EKG 12-Lead   LONG TERM MONITOR (3-14 DAYS)   VAS US CAROTID   ECHOCARDIOGRAM COMPLETE   Orthostatic dizziness    I suspect this  underlying potential finding is the etiology for her syncopal episode.  She probably had baseline low regular blood pressure and interestingly, this happened after eating, but she did not feel well after eating it is possible that she had a vagal episode triggered by the need for BM.  Post catharsis, she came out feeling okay.  Increase p.o. hydration with increased water, liquid IV etc. try to avoid Gatorade or Powerade due to excess glucose.  Reassess in follow-up. ->  If she has more symptoms, may consider further evaluation, and consider midodrine       Relevant Orders   EKG 12-Lead   Blurred vision - r/o amaurosis fugax    Probably nothing, may very well be related to hypotension, but with her being concern for amaurosis fugax with her vision symptoms, we will check a carotid Dopplers to exclude stenosis or even potential dissection      Relevant Orders   EKG 12-Lead    ===================================  HPI:    Monica Woods is a 65 y.o. female who is being seen today for the evaluation of Bradycardia and Syncope at the request of Cyndi Bender, PA-C.  Monica Woods was last seen in September 2022 by Dr. Kae Heller, [place].  She had an episode of syncope and was noted to have bradycardia.  Referred for cardiac evaluation.  Recent Hospitalizations: None  Reviewed  CV studies:    The following studies  were reviewed today: (if available, images/films reviewed: From Epic Chart or Care Everywhere) Echo Stress Test 12/04/2014 [facility]): Resting and stress EF > 55%.  Normal contraction.  Mild MR, mild TR. 24-hour Holter monitor December 04, 2014: Heart rate range 51-00 beats minute.  Average 67 bpm.  2 PVCs noted.  2 short bursts of SVT   Interval History:   Monica Woods presents for evaluation of a syncopal episode that happened back in September. Basis he tells me that up until about May of this year she was doing fine.  She was able to walk for an hour at a time  and in the park and no major issues, but she is had progressively worsening arthritis pains in her knee and back.  As such, she has not been able to be as active. Starting about May June timeframe, she started noticing unusual symptoms of dizziness this happen more frequently, usually associated with standing up, or certain movements.  Does not describe symptoms of of vertigo.  Just dizzy and drowsy.  She has not had any syncope or near syncope.  The syncopal episode was early September.  She had been out to dinner with her sister-in-law, they were driving home she started feeling a little bit queasy and as though she needed to have a bowel movement.  She felt a little bit flushed and when she stood up felt lightheaded and woozy.  By the time she reached the house, she fell out and lost consciousness.  EMS arrival revealed normal glucose levels and relatively normal EMS visit ER without  transport to further level care.  In addition to the 1 episode of syncope, she had 1 occasion during which she felt strange but seemed to have some visual field cut or almost full brief episode of amaurosis fugax on the left.   CV Review of Symptoms (Summary) Cardiovascular ROS: positive for - most notable for intermittent dizziness and positional more than usual.  (Much better over the last couple weeks).  1 episode of syncope.  She did have 1 episode where she had some blurred vision that came and went.  Otherwise no real TIA symptoms. negative for - chest pain, dyspnea on exertion, edema, orthopnea, palpitations, paroxysmal nocturnal dyspnea, rapid heart rate, shortness of breath, or further episodes of syncope; Claudication symptoms  REVIEWED OF SYSTEMS   Review of Systems  Constitutional:  Positive for weight loss (Actually gaining weight). Negative for malaise/fatigue (Has been a little fearful since her syncopal episode.  Not exercising anymore.  Mostly limited by arthritis pains.).  HENT:  Negative for  congestion, hearing loss and sinus pain.   Eyes:  Positive for blurred vision (With 1 episode of lightheadedness had a brief episode of blurred vision-difficult assess if this is amaurosis fugax). Negative for pain and discharge.  Respiratory:  Positive for hemoptysis. Negative for cough and shortness of breath.   Cardiovascular:  Negative for leg swelling (Trivial).  Gastrointestinal:  Positive for abdominal pain and nausea. Negative for blood in stool, constipation (Not really constipation, just a sense of needing to have BM prior to syncope.) and melena.  Genitourinary:  Negative for hematuria.  Musculoskeletal:  Positive for back pain, joint pain (Bilateral knee pain, now limiting her activity.  Also plan to fasciitis treated with injections and brace.) and myalgias (Pretty significant cramps). Negative for neck pain.  Neurological:  Positive for dizziness and loss of consciousness (Per HPI). Negative for headaches.       Lightheadedness, orthostatic dizziness-associated with  sensation of needing to have BM.  Endo/Heme/Allergies:  Positive for environmental allergies.  Psychiatric/Behavioral:  Negative for memory loss. The patient is not nervous/anxious and does not have insomnia.    I have reviewed and (if needed) personally updated the patient's problem list, medications, allergies, past medical and surgical history, social and family history.   PAST MEDICAL HISTORY   Past Medical History:  Diagnosis Date   High cholesterol    Had been on Lovaza and Crestor, but she did not realize that she is will stay on these.  No longer taking them.   Pre-diabetes     PAST SURGICAL HISTORY   Past Surgical History:  Procedure Laterality Date   BREAST BIOPSY Right 04/02/2014   neg stereo for FIBROADENOMATOUS CHANGES WITH SCLEROSIS AND STROMAL      There is no immunization history on file for this patient.  MEDICATIONS/ALLERGIES   Current Meds  Medication Sig   Beta Carotene (VITAMIN A)  25000 UNIT capsule Take 8,000 Units by mouth daily.   brimonidine (ALPHAGAN) 0.2 % ophthalmic solution SMARTSIG:In Eye(s)   diphenhydrAMINE (BENADRYL) 25 mg capsule Take 25 mg by mouth every 6 (six) hours as needed.   folic acid (FOLVITE) 102 MCG tablet Take 400 mcg by mouth daily.   latanoprost (XALATAN) 0.005 % ophthalmic solution SMARTSIG:In Eye(s)   magnesium 30 MG tablet Take 250 mg by mouth daily.   meloxicam (MOBIC) 15 MG tablet Take 1 tablet (15 mg total) by mouth daily.   Multiple Vitamin (MULTIVITAMIN) capsule Take 1 capsule by mouth daily.   omega-3 acid ethyl esters (LOVAZA) 1 g capsule Take by mouth daily.   ondansetron (ZOFRAN ODT) 8 MG disintegrating tablet Take 1 tablet (8 mg total) by mouth every 8 (eight) hours as needed.   oxyCODONE-acetaminophen (PERCOCET) 5-325 MG tablet Take 1 tablet by mouth every 6 (six) hours as needed for severe pain.   rosuvastatin (CRESTOR) 10 MG tablet Take 10 mg by mouth at bedtime.   vitamin E 400 UNIT capsule Take 400 Units by mouth daily.    Allergies  Allergen Reactions   Levonorgestrel-Ethinyl Estrad     SOCIAL HISTORY/FAMILY HISTORY   Reviewed in Epic:   Social History   Tobacco Use   Smoking status: Never   Smokeless tobacco: Never  Vaping Use   Vaping Use: Never used  Substance Use Topics   Alcohol use: Never    Alcohol/week: 0.0 standard drinks   Drug use: Never   Social History   Social History Narrative   Not on file   Family History  Problem Relation Age of Onset   Hypertension Mother    Stroke Mother    Hypertension Father    Breast cancer Neg Hx     OBJCTIVE -PE, EKG, labs   Wt Readings from Last 3 Encounters:  06/09/21 146 lb (66.2 kg)  01/05/19 138 lb (62.6 kg)    Physical Exam: BP 108/66 (BP Location: Left Arm, Patient Position: Sitting, Cuff Size: Normal)   Pulse (!) 56   Ht 5\' 4"  (1.626 m)   Wt 146 lb (66.2 kg)   LMP  (LMP Unknown)   SpO2 98%   BMI 25.06 kg/m  Orthostatic VS for the past  24 hrs (Last 3 readings):  BP- Lying Pulse- Lying BP- Sitting Pulse- Sitting BP- Standing at 0 minutes Pulse- Standing at 0 minutes BP- Standing at 3 minutes Pulse- Standing at 3 minutes  06/09/21 1501 106/64 54 96/57 57 91/55 63 105/67 66  Physical Exam Vitals reviewed.  Constitutional:      General: She is not in acute distress.    Appearance: Normal appearance. She is normal weight. She is not ill-appearing or toxic-appearing.  HENT:     Head: Normocephalic and atraumatic.  Eyes:     Extraocular Movements: Extraocular movements intact.     Pupils: Pupils are equal, round, and reactive to light.  Neck:     Vascular: No carotid bruit or JVD.  Cardiovascular:     Rate and Rhythm: Regular rhythm. Bradycardia present. No extrasystoles are present.    Chest Wall: PMI is not displaced.     Pulses: Normal pulses.     Heart sounds: S1 normal and S2 normal. No murmur heard.   No gallop.  Pulmonary:     Effort: Pulmonary effort is normal. No respiratory distress.     Breath sounds: Normal breath sounds. No stridor. No wheezing or rales.  Chest:     Chest wall: No tenderness.  Abdominal:     General: Abdomen is flat. Bowel sounds are normal. There is no distension.     Palpations: Abdomen is soft. There is no mass (No HSM or bruit).     Tenderness: There is no abdominal tenderness.  Musculoskeletal:        General: No swelling. Normal range of motion.     Cervical back: Normal range of motion and neck supple.  Skin:    General: Skin is warm and dry.  Neurological:     General: No focal deficit present.     Mental Status: She is alert and oriented to person, place, and time.     Cranial Nerves: No cranial nerve deficit.  Psychiatric:        Mood and Affect: Mood normal.        Behavior: Behavior normal.        Thought Content: Thought content normal.        Judgment: Judgment normal.     Adult ECG Report  Rate: 56 ;  Rhythm: sinus bradycardia and otherwise normal axis,  intervals and durations. ;   Narrative Interpretation: Borderline EKG.  Recent Labs:   01/25/2021:   Na+ 143, K+ 4.2, Cl- 105, HCO3-27, BUN 20, Cr 0.74, Glu 138, Ca2+ 9.5; AST 24, ALT 33, AlkP 76 Hgb 13.4 ;Hgb A1c 6.7, TC 221, TG 98, HDL 78, LDL 126  No results found for: CHOL, HDL, LDLCALC, LDLDIRECT, TRIG, CHOLHDL Lab Results  Component Value Date   CREATININE 0.62 01/05/2019   BUN 28 (H) 01/05/2019   NA 137 01/05/2019   K 3.7 01/05/2019   CL 102 01/05/2019   CO2 25 01/05/2019   CBC Latest Ref Rng & Units 01/05/2019  WBC 4.0 - 10.5 K/uL 6.4  Hemoglobin 12.0 - 15.0 g/dL 12.9  Hematocrit 36.0 - 46.0 % 38.8  Platelets 150 - 400 K/uL 175    No results found for: HGBA1C No results found for: TSH  ==================================================  COVID-19 Education: The signs and symptoms of COVID-19 were discussed with the patient and how to seek care for testing (follow up with PCP or arrange E-visit).    I spent a total of 40  minutes with the patient spent in direct patient consultation.  Additional time spent with chart review  / charting (studies, outside notes, etc): 22 min Total Time: 62 min  Current medicines are reviewed at length with the patient today.  (+/- concerns) no longer taking cholesterol medicines, I think she was confused  that she was supposed to continue them.  This visit occurred during the SARS-CoV-2 public health emergency.  Safety protocols were in place, including screening questions prior to the visit, additional usage of staff PPE, and extensive cleaning of exam room while observing appropriate contact time as indicated for disinfecting solutions.  Notice: This dictation was prepared with Dragon dictation along with smart phrase technology. Any transcriptional errors that result from this process are unintentional and may not be corrected upon review.   Studies Ordered:  Orders Placed This Encounter  Procedures   LONG TERM MONITOR (3-14 DAYS)    EKG 12-Lead   ECHOCARDIOGRAM COMPLETE   VAS US CAROTID     Patient Instructions / Medication Changes & Studies & Tests Ordered   Patient Instructions  Medication Instructions:  Your physician recommends that you continue on your current medications as directed. Please refer to the Current Medication list given to you today.  *If you need a refill on your cardiac medications before your next appointment, please call your pharmacy*   Lab Work: - none ordered  If you have labs (blood work) drawn today and your tests are completely normal, you will receive your results only by: [place] (if you have MyChart) OR A paper copy in the mail If you have any lab test that is abnormal or we need to change your treatment, we will call you to review the results.   Testing/Procedures:  1) Heart Monitor:  - Your physician has recommended that you wear a Zio XT (heart) monitor.   - Placed in office today - Dates of wear: 06/09/21- 06/23/21  Follow-Up: At [facility], you and your health needs are our priority.  As part of our continuing mission to provide you with exceptional heart care, we have created designated Provider Care Teams.  These Care Teams include your primary Cardiologist (physician) and Advanced Practice Providers (APPs -  Physician Assistants and Nurse Practitioners) who all work together to provide you with the care you need, when you need it.  We recommend signing up for the patient portal called "MyChart".  Sign up information is provided on this After Visit Summary.  MyChart is used to connect with patients for Virtual Visits (Telemedicine).  Patients are able to view lab/test results, encounter notes, upcoming appointments, etc.  Non-urgent messages can be sent to your provider as well.   To learn more about what you can do with MyChart, go to [email or web address].    Your next appointment:   5-6 week(s)  The format for your next appointment:   In  Person  Provider:   Glenetta Hew, MD   Other Instructions  1) Stay well hydrated - use Liquid IV or Pedialyte Advanced  2) Wear compression socks/ a tight fitting sock up to the knee  3) Try to elevate your lower extremities when sitting      Glenetta Hew, M.D., M.S. Interventional Cardiologist   Pager # [phone] Phone # [phone] [address]. [place], [place] [address]   Thank you for choosing Heartcare at [facility]!!

## 2021-06-09 NOTE — Patient Instructions (Signed)
Medication Instructions:  Your physician recommends that you continue on your current medications as directed. Please refer to the Current Medication list given to you today.  *If you need a refill on your cardiac medications before your next appointment, please call your pharmacy*   Lab Work: - none ordered  If you have labs (blood work) drawn today and your tests are completely normal, you will receive your results only by: [place] (if you have MyChart) OR A paper copy in the mail If you have any lab test that is abnormal or we need to change your treatment, we will call you to review the results.   Testing/Procedures:  1) Heart Monitor:  - Your physician has recommended that you wear a Zio XT (heart) monitor.   - Placed in office today - Dates of wear: 06/09/21- 06/23/21  This monitor is a medical device that records the heart's electrical activity. Doctors most often use these monitors to diagnose arrhythmias. Arrhythmias are problems with the speed or rhythm of the heartbeat. The monitor is a small device applied to your chest. You can wear one while you do your normal daily activities. While wearing this monitor if you have any symptoms to push the button and record what you felt. Once you have worn this monitor for the period of time provider prescribed (Usually 14 days), you will return the monitor device in the postage paid box. Once it is returned they will download the data collected and provide [place] with a report which the provider will then review and we will call you with those results. Important tips:  Avoid showering during the first 24 hours of wearing the monitor. Avoid excessive sweating to help maximize wear time. Do not submerge the device, no hot tubs, and no swimming pools. Keep any lotions or oils away from the patch. After 24 hours you may shower with the patch on. Take brief showers with your back facing the shower head.  Do not remove patch once it has  been placed because that will interrupt data and decrease adhesive wear time. Push the button when you have any symptoms and write down what you were feeling. Once you have completed wearing your monitor, remove and place into box which has postage paid and place in your outgoing mailbox.  If for some reason you have misplaced your box then call our office and we can provide another box and/or mail it off for you.   2) Carotid ultrasound: - Your physician has requested that you have a carotid duplex. This test is an ultrasound of the carotid arteries in your neck. It looks at blood flow through these arteries that supply the brain with blood. Allow one hour for this exam. There are no restrictions or special instructions.  3) Echocardiogram: - Your physician has requested that you have an echocardiogram (after 06/23/21). Echocardiography is a painless test that uses sound waves to create images of your heart. It provides your doctor with information about the size and shape of your heart and how well your heart's chambers and valves are working. This procedure takes approximately one hour. There are no restrictions for this procedure. There is a possibility that an IV may need to be started during your test to inject an image enhancing agent. This is done to obtain more optimal pictures of your heart. Therefore we ask that you do at least drink some water prior to coming in to hydrate your veins.     Follow-Up: At [facility], you and your health needs are our priority.  As part of our continuing mission to provide you with exceptional heart care, we have created designated Provider Care Teams.  These Care Teams include your primary Cardiologist (physician) and Advanced Practice Providers (APPs -  Physician Assistants and Nurse Practitioners) who all work together to provide you with the care you need, when you need it.  We recommend signing up for the patient portal called "MyChart".  Sign up  information is provided on this After Visit Summary.  MyChart is used to connect with patients for Virtual Visits (Telemedicine).  Patients are able to view lab/test results, encounter notes, upcoming appointments, etc.  Non-urgent messages can be sent to your provider as well.   To learn more about what you can do with MyChart, go to [email or web address].    Your next appointment:   5-6 week(s)  The format for your next appointment:   In Person  Provider:   Glenetta Hew, MD   Other Instructions  1) Stay well hydrated - use Liquid IV or Pedialyte Advanced  2) Wear compression socks/ a tight fitting sock up to the knee  3) Try to elevate your lower extremities when sitting   Ecocardiograma Echocardiogram Un ecocardiograma es una prueba que [place] ondas sonoras (ecografa) para obtener imgenes del corazn. Las imgenes de un ecocardiograma pueden proporcionar informacin importante sobre lo siguiente: Tamao y forma del corazn. [place], el grosor y el movimiento de las paredes del Training and development officer. La funcin y la fuerza del msculo cardaco. La funcin de la vlvula cardaca o si tiene estenosis. Se presenta estenosis cuando las vlvulas cardacas son demasiado estrechas. Si la sangre [employer] atrs a travs de las vlvulas cardacas (regurgitacin). Un tumor o crecimiento infeccioso alrededor de las vlvulas cardacas. reas del msculo cardaco que no funcionan bien debido a un flujo sanguneo deficiente o a una lesin a causa de un infarto de miocardio. Signos de aneurisma. Un aneurisma es una parte debilitada o daada en la pared de una arteria. La pared se abulta debido a la fuerza normal que produce el bombeo de la sangre a travs del cuerpo. Informe al mdico acerca de lo siguiente: Cualquier alergia que tenga. Todos los [employer] [place], incluidos vitaminas, hierbas, gotas oftlmicas, cremas y medicamentos de venta libre. Cualquier trastorno de la sangre que  tenga. Cirugas a las que se haya sometido. Cualquier afeccin mdica que tenga. Si est embarazada o podra estarlo. Cules son los riesgos? Por lo general, se trata de una prueba segura. Sin embargo, pueden presentarse problemas, como una reaccin alrgica al tinte IT consultant) que se puede usar durante la prueba. Qu ocurre antes de esta prueba? No se requiere Associate Professor. Podr comer y beber normalmente. Qu ocurre durante la prueba?  Se quitar la ropa de la cintura para [place] y se pondr una bata de hospital. Es posible que le coloquen electrodos oparches de electrocardiograma (ECG) en el pecho. Luego los electrodos o parches se conectan a un dispositivo que controla su ritmo y frecuencia cardaca. Se acostar sobre una mesa para que le realicen una ecografa. Se le aplicar un gel en el pecho para ayudar a que las ondas sonoras pasen a travs de la piel. Se presionar un dispositivo de mano, llamado transductor, contra el pecho y se mover sobre su corazn. El transductor produce ondas sonoras que viajan hasta el corazn y rebotan (o producen "eco") Designer, jewellery. Estas ondas sonoras se captarn en tiempo real y se  transformarn en imgenes del corazn que se pueden ver en un monitor de vdeo. Las imgenes se grabarn en una computadora y sern revisadas por el mdico. Podrn solicitarle que cambie de posicin o que contenga la respiracin durante un lapso breve. Esto hace que sea ms fcil obtener diferentes vistas o mejores vistas del corazn. En algunos casos, puede recibir [place] a travs de una va intravenosa (i.v). en una de las venas. Esto puede mejorar la calidad de las imgenes del corazn. El procedimiento puede variar segn el mdico y el hospital. Qu puedo esperar despus de la prueba? Puede retomar su rutina diaria normal, incluidos la dieta, las actividades y [employer], a menos que su mdico le indique lo contrario. Siga estas  instrucciones en su casa: Recoger los [employer] de la prueba es su responsabilidad. Consulte al mdico o pregunte en el departamento donde se realiza la prueba cundo estarn [employer]. Cumpla con todas las visitas de seguimiento. Esto es importante. Resumen Un ecocardiograma es una prueba que [place] ondas sonoras (ecografa) para obtener imgenes del corazn. Las [employer] de un ecocardiograma pueden brindar informacin importante sobre el tamao y la forma del corazn, la funcin del msculo cardaco, la funcin de la vlvula cardaca y otros posibles problemas cardacos. No es necesario prepararse para esta prueba. Podr comer y beber normalmente. Al finalizar el ecocardiograma, puede retomar su rutina diaria normal, a menos que su mdico le indique lo contrario. Esta informacin no tiene Marine scientist el consejo del mdico. Asegrese de hacerle al mdico cualquier pregunta que tenga. Document Revised: 06/17/2020 Document Reviewed: 06/17/2020 Elsevier Patient Education  2022 [employer].

## 2021-06-09 NOTE — Assessment & Plan Note (Signed)
She appears to have baseline bradycardia.  This was noted in her clinic visit with Cyndi Bender, PA.  Not so slow that it would cause syncope, but need to exclude any heart block or pauses.  Plan:  Avoid AV nodal agents  14-day Zio patch monitor

## 2021-06-14 ENCOUNTER — Ambulatory Visit: Payer: No Typology Code available for payment source | Admitting: Podiatry

## 2021-06-21 ENCOUNTER — Ambulatory Visit: Payer: No Typology Code available for payment source | Admitting: Podiatry

## 2021-06-21 ENCOUNTER — Other Ambulatory Visit: Payer: Self-pay

## 2021-06-21 DIAGNOSIS — M722 Plantar fascial fibromatosis: Secondary | ICD-10-CM

## 2021-06-21 MED ORDER — BETAMETHASONE SOD PHOS & ACET 6 (3-3) MG/ML IJ SUSP
3.0000 mg | Freq: Once | INTRAMUSCULAR | Status: AC
  Administered 2021-06-21: 3 mg via INTRA_ARTICULAR

## 2021-06-21 NOTE — Progress Notes (Signed)
   Subjective: 65 y.o. female presenting today for follow-up evaluation of plantar fasciitis to the left heel.  Patient states that she continues to have pain and tenderness to the left heel.  She has tried different insoles with minimal relief.  She also says the injection did not help significantly.  She also continues to take meloxicam daily with minimal improvement.  She presents for further treatment and evaluation   Past Medical History:  Diagnosis Date   High cholesterol    Had been on Lovaza and Crestor, but she did not realize that she is will stay on these.  No longer taking them.   Pre-diabetes      Objective: Physical Exam General: The patient is alert and oriented x3 in no acute distress.  Dermatology: Skin is warm, dry and supple bilateral lower extremities. Negative for open lesions or macerations bilateral.   Vascular: Dorsalis Pedis and Posterior Tibial pulses palpable bilateral.  Capillary fill time is immediate to all digits.  Neurological: Epicritic and protective threshold intact bilateral.   Musculoskeletal: Tenderness to palpation to the plantar aspect of the left heel along the plantar fascia. All other joints range of motion within normal limits bilateral. Strength 5/5 in all groups bilateral.   Radiographic exam 05/06/2021: Normal osseous mineralization. Joint spaces preserved. No fracture/dislocation/boney destruction. No other soft tissue abnormalities or radiopaque foreign bodies.   Assessment: 1. Plantar fasciitis left foot  Plan of Care:  1. Patient evaluated..   2. Injection of 0.5cc Celestone soluspan injected into the left plantar fascia.  3.  OTC power step insoles provided for the patient to see if this helps alleviate pressure from the arch  4.  Continue meloxicam 15 mg daily 5.  Night splint dispensed.  Wear nightly 6.  Return to clinic in 4 weeks  *From [place]   Francois Elk M. Einer Meals, DPM [facility]  Dr. Edrick Kins, DPM     [address], [place] [address]                Office [phone]  Fax [phone]

## 2021-07-20 ENCOUNTER — Other Ambulatory Visit: Payer: Self-pay

## 2021-07-20 ENCOUNTER — Ambulatory Visit (INDEPENDENT_AMBULATORY_CARE_PROVIDER_SITE_OTHER): Payer: No Typology Code available for payment source

## 2021-07-20 DIAGNOSIS — R55 Syncope and collapse: Secondary | ICD-10-CM | POA: Diagnosis not present

## 2021-07-20 HISTORY — PX: TRANSTHORACIC ECHOCARDIOGRAM: SHX275

## 2021-07-20 LAB — ECHOCARDIOGRAM COMPLETE
AR max vel: 2.73 cm2
AV Area VTI: 2.84 cm2
AV Area mean vel: 2.59 cm2
AV Mean grad: 4 mmHg
AV Peak grad: 6.9 mmHg
Ao pk vel: 1.31 m/s
Area-P 1/2: 2.66 cm2
Calc EF: 60.4 %
S' Lateral: 3.1 cm
Single Plane A2C EF: 60.2 %
Single Plane A4C EF: 62.6 %

## 2021-07-21 ENCOUNTER — Encounter: Payer: Self-pay | Admitting: Cardiology

## 2021-07-21 ENCOUNTER — Ambulatory Visit: Payer: No Typology Code available for payment source | Admitting: Cardiology

## 2021-07-21 VITALS — BP 120/70 | HR 64 | Ht 64.0 in | Wt 146.0 lb

## 2021-07-21 DIAGNOSIS — R001 Bradycardia, unspecified: Secondary | ICD-10-CM

## 2021-07-21 DIAGNOSIS — R55 Syncope and collapse: Secondary | ICD-10-CM | POA: Diagnosis not present

## 2021-07-21 DIAGNOSIS — R42 Dizziness and giddiness: Secondary | ICD-10-CM | POA: Diagnosis not present

## 2021-07-21 NOTE — Assessment & Plan Note (Signed)
She has not had any further syncope episodes, but is having some lightheaded dizzy symptoms that tends to go along with what sounds like orthostatic dizziness and a tendency to vasovagal syncope.  Today, her cardiac evaluation is otherwise been benign.  Normal echocardiogram and carotid Dopplers and no arrhythmias on monitor.  I do not think any further evaluation is needed.  1) Stay well hydrated - use Liquid IV or Pedialyte Advanced; G2 Gatorade or low sugar Powerade are also options.  2) Wear compression socks/ a tight fitting sock up to the knee  3) Try to elevate your lower extremities when sitting

## 2021-07-21 NOTE — Patient Instructions (Signed)
Medication Instructions:  -Your physician recommends that you continue on your current medications as directed. Please refer to the Current Medication list given to you today.  *If you need a refill on your cardiac medications before your next appointment, please call your pharmacy*   Lab Work: - none ordered  If you have labs (blood work) drawn today and your tests are completely normal, you will receive your results only by: [place] (if you have MyChart) OR A paper copy in the mail If you have any lab test that is abnormal or we need to change your treatment, we will call you to review the results.   Testing/Procedures: - none ordered   Follow-Up: At [facility], you and your health needs are our priority.  As part of our continuing mission to provide you with exceptional heart care, we have created designated Provider Care Teams.  These Care Teams include your primary Cardiologist (physician) and Advanced Practice Providers (APPs -  Physician Assistants and Nurse Practitioners) who all work together to provide you with the care you need, when you need it.  We recommend signing up for the patient portal called "MyChart".  Sign up information is provided on this After Visit Summary.  MyChart is used to connect with patients for Virtual Visits (Telemedicine).  Patients are able to view lab/test results, encounter notes, upcoming appointments, etc.  Non-urgent messages can be sent to your provider as well.   To learn more about what you can do with MyChart, go to [email or web address].    Your next appointment:   4-5 month(s)  The format for your next appointment:   In Person  Provider:   Glenetta Hew, MD    Other Instructions  1) If you start to feel dizzy/ nauseated/ flushed, please sit down, drink water, and eat something

## 2021-07-21 NOTE — Progress Notes (Signed)
Primary Care Provider: Cyndi Bender, PA-C [facility] Cardiologist: None Electrophysiologist: None  Clinic Note: Chief Complaint  Patient presents with   Follow-up    6 Week follow up and medications verbally reviewed with patient   Results    Echocardiogram, monitor, carotid Dopplers   Loss of Consciousness    No further episodes     ===================================  ASSESSMENT/PLAN   Problem List Items Addressed This Visit       Cardiology Problems   Bradycardia, sinus    Minimal bradycardia.  She is still relatively young and I do not see any significant bradycardia on her monitor.  Her smart watch said her heart rate was 48 today while I was seeing her, but on vital signs her heart rate was 64.  I explained to her that she would not pass out with a heart rate of 48, but we did need to be in the 30s for her to pass out.  As long as her heart rate goes up with exercise, she is okay.        Other   Orthostatic dizziness    Patient instructions are same as for vasovagal.  Also recommended when she feels lightheaded dizzy or nauseated but we want her to sit down and eat something or drink something. Continue to wear support stockings and stay very well-hydrated at baseline.  (Roughly 80 ounces a day) - use Liquid IV or Pedialyte Advanced; G2 Gatorade or low sugar Powerade are also options. Also discussed bilateral leg elevation.  Although I do not see any real venous stasis changes.       Vasovagal syncope - Primary (Chronic)    She has not had any further syncope episodes, but is having some lightheaded dizzy symptoms that tends to go along with what sounds like orthostatic dizziness and a tendency to vasovagal syncope.  Today, her cardiac evaluation is otherwise been benign.  Normal echocardiogram and carotid Dopplers and no arrhythmias on monitor.  I do not think any further evaluation is needed.  1) Stay well hydrated - use Liquid IV or Pedialyte  Advanced; G2 Gatorade or low sugar Powerade are also options.  2) Wear compression socks/ a tight fitting sock up to the knee  3) Try to elevate your lower extremities when sitting      ===================================  HPI:    Filippa Yarbough is a 65 y.o. female who is being seen today for follow-up evaluation of Bradycardia and Syncope at the request of Cyndi Bender, PA-C.  Oona Trammel was seen in initial consult on June 09, 2021 at the request of Garlan Fair, [place].  In response to an episode of syncope back in September.  Per report, she was noted to be bradycardic during that episode.   --> She had been doing fine up until May of this year walking about an hour at a time in the park without any difficulty.  Worsening arthritis pains in her knee and back.  Started having dizzy symptoms in May and June usually with standing up and certain movements.  Some lightheadedness and dizziness but no syncope.  With the episode of syncope, she had been out to dinner with her sister-in-law, she felt queasy as though she needs a bowel movement.  She then proceeded to have lightheadedness and wooziness with a sensation of flushing and had lost consciousness upon arrival to the house.  He was noted by EMS to be normoglycemic and ability normal EKG.  She did not go to  the ER.  She had 1 episode somewhat similar to that since but did not pass out.  Recent Hospitalizations: None  Reviewed  CV studies:    The following studies were reviewed today: (if available, images/films reviewed: From Epic Chart or Care Everywhere) 14-Day Zio Patch Event Monitor (October 2020): Predominant underlying rhythm was Sinus Rhythm: Minimum heart rate 45 bpm, maximum 118 bpm. Average 67 bpm. Rare isolated PACs (with rare couplets and triplets) Rare isolated PVCs with rare couplets. No PVC triplets, bigeminy or trigeminy noted. No arrhythmia either tachycardic or bradycardic: No atrial fibrillation, atrial  flutter, sustained SVT or PAT (supraventricular or paroxysmal atrial tachycardia Minimum heart rate of 45 bpm noted at 6 AM; maximum heart rate of 118 bpm noted at 6:36 PM. No notable/sustained bradycardia.   Carotid Dopplers 07/20/2021: Formal read pending.  Does not appear to have significant carotid disease or vertebral artery disease.  It does appear to be turbulent flow in the Left subclavian artery.  TTE 07/20/2021:EF 55 to 60%.  Normal wall motion.  Normal diastolic parameters.  Normal RV.  Normal aortic and mitral valve.  (Normal study)   Interval History:   Fayelynn Distel presents for evaluation of a syncopal episode that happened back in September.  Cardiac studies completed - essentially normal. No further syncope, but does have mild positional dizziness.   She denies any other cardiac symptoms or chest pain or pressure with rest or exertion.  No real palpitation symptoms.  No PND, orthopnea or edema.    REVIEWED OF SYSTEMS   Review of Systems  Constitutional:  Positive for weight loss (Actually gaining weight). Negative for malaise/fatigue (Has been a little fearful since her syncopal episode.  Not exercising anymore.  Mostly limited by arthritis pains.).  HENT:  Negative for congestion, hearing loss and sinus pain.   Eyes:  Negative for pain and discharge. Blurred vision: With 1 episode of lightheadedness had a brief episode of blurred vision-difficult assess if this is amaurosis fugax. Respiratory:  Negative for cough and shortness of breath.   Cardiovascular:  Negative for palpitations and leg swelling (Trivial).  Gastrointestinal:  Negative for blood in stool, constipation (Not really constipation, just a sense of needing to have BM prior to syncope.) and melena.  Genitourinary:  Negative for hematuria.  Musculoskeletal:  Positive for joint pain (Bilateral knee pain, now limiting her activity.  Also plan to fasciitis treated with injections and brace.) and myalgias  (Pretty significant cramps). Negative for back pain and neck pain.       R shin hurts with standing.   Neurological:  Positive for dizziness. Negative for loss of consciousness (Per HPI) and headaches.       Lightheadedness, orthostatic dizziness-associated with sensation of needing to have BM.  Endo/Heme/Allergies:  Positive for environmental allergies.  Psychiatric/Behavioral:  Negative for memory loss. The patient is not nervous/anxious and does not have insomnia.    I have reviewed and (if needed) personally updated the patient's problem list, medications, allergies, past medical and surgical history, social and family history.   PAST MEDICAL HISTORY   Past Medical History:  Diagnosis Date   High cholesterol    Had been on Lovaza and Crestor, but she did not realize that she is will stay on these.  No longer taking them.   Pre-diabetes     PAST SURGICAL HISTORY   Past Surgical History:  Procedure Laterality Date   14-day Zio Patch Monitor  06/2019   Predominantly NSR: Rate range 45-118 bpm (  minimum heart rate at 6 AM, maximum heart rate 6:30 PM).  No sustained bradycardia., average 67 bpm.  Rare isolated PACs and PVCs. No arrhythmia either tachycardic or bradycardic: No atrial fibrillation, atrial flutter, sustained SVT or PAT (supraventricular or paroxysmal atrial tachycardia).   24-hour Holter Monitor  12/2014   Heart rate range 51-00 beats minute.  Average 67 bpm.  2 PVCs noted.  2 short bursts of SVT   BREAST BIOPSY Right 04/02/2014   neg stereo for FIBROADENOMATOUS CHANGES WITH SCLEROSIS AND STROMAL    STRESS ECHOCARDIOGRAM  12/2014   [facility]): Resting and stress EF > 55%.  Normal contraction.  Mild MR, mild TR.   TRANSTHORACIC ECHOCARDIOGRAM  07/20/2021   [facility]: EF 55 to 60%.  Normal wall motion.  Normal diastolic parameters.  Normal RV.  Normal aortic and mitral valve.  (Normal study)     There is no immunization history on file for this  patient.  MEDICATIONS/ALLERGIES   Current Meds  Medication Sig   Beta Carotene (VITAMIN A) 25000 UNIT capsule Take 8,000 Units by mouth daily.   brimonidine (ALPHAGAN) 0.2 % ophthalmic solution SMARTSIG:In Eye(s)   diphenhydrAMINE (BENADRYL) 25 mg capsule Take 25 mg by mouth every 6 (six) hours as needed.   folic acid (FOLVITE) 867 MCG tablet Take 400 mcg by mouth daily.   latanoprost (XALATAN) 0.005 % ophthalmic solution SMARTSIG:In Eye(s)   magnesium 30 MG tablet Take 250 mg by mouth daily.   meloxicam (MOBIC) 15 MG tablet Take 1 tablet (15 mg total) by mouth daily.   Multiple Vitamin (MULTIVITAMIN) capsule Take 1 capsule by mouth daily.   omega-3 acid ethyl esters (LOVAZA) 1 g capsule Take by mouth daily.   vitamin E 400 UNIT capsule Take 400 Units by mouth daily.    Allergies  Allergen Reactions   Levonorgestrel-Ethinyl Estrad     SOCIAL HISTORY/FAMILY HISTORY   Reviewed in Epic:   Social History   Tobacco Use   Smoking status: Never   Smokeless tobacco: Never  Vaping Use   Vaping Use: Never used  Substance Use Topics   Alcohol use: Never    Alcohol/week: 0.0 standard drinks   Drug use: Never   Social History   Social History Narrative   Not on file   Family History  Problem Relation Age of Onset   Hypertension Mother    Stroke Mother    Hypertension Father    Breast cancer Neg Hx     OBJCTIVE -PE, EKG, labs   Wt Readings from Last 3 Encounters:  07/21/21 146 lb (66.2 kg)  06/09/21 146 lb (66.2 kg)  01/05/19 138 lb (62.6 kg)    Physical Exam: BP 120/70 (BP Location: Left Arm, Patient Position: Sitting, Cuff Size: Normal)   Pulse 64   Ht 5\' 4"  (1.626 m)   Wt 146 lb (66.2 kg)   LMP  (LMP Unknown)   SpO2 97%   BMI 25.06 kg/m  Physical Exam Vitals reviewed.  Constitutional:      General: She is not in acute distress.    Appearance: Normal appearance. She is normal weight. She is not ill-appearing or toxic-appearing.  HENT:     Head:  Normocephalic and atraumatic.  Neck:     Vascular: No carotid bruit or JVD.  Cardiovascular:     Rate and Rhythm: Regular rhythm. Bradycardia present. No extrasystoles are present.    Chest Wall: PMI is not displaced.     Pulses: Normal pulses.  Heart sounds: S1 normal and S2 normal. No murmur heard.   No gallop.  Pulmonary:     Effort: Pulmonary effort is normal. No respiratory distress.     Breath sounds: Normal breath sounds. No stridor. No wheezing or rales.  Chest:     Chest wall: No tenderness.  Abdominal:     Palpations: Mass: No HSM or bruit.  Musculoskeletal:        General: No swelling. Normal range of motion.     Cervical back: Normal range of motion and neck supple.  Neurological:     General: No focal deficit present.     Mental Status: She is alert and oriented to person, place, and time.     Cranial Nerves: No cranial nerve deficit.  Psychiatric:        Mood and Affect: Mood normal.        Behavior: Behavior normal.        Thought Content: Thought content normal.        Judgment: Judgment normal.     Adult ECG Report Not checked  Recent Labs: No new labs. 01/25/2021:   Na+ 143, K+ 4.2, Cl- 105, HCO3-27, BUN 20, Cr 0.74, Glu 138, Ca2+ 9.5; AST 24, ALT 33, AlkP 76 Hgb 13.4 ;Hgb A1c 6.7, TC 221, TG 98, HDL 78, LDL 126  No results found for: CHOL, HDL, LDLCALC, LDLDIRECT, TRIG, CHOLHDL Lab Results  Component Value Date   CREATININE 0.62 01/05/2019   BUN 28 (H) 01/05/2019   NA 137 01/05/2019   K 3.7 01/05/2019   CL 102 01/05/2019   CO2 25 01/05/2019   CBC Latest Ref Rng & Units 01/05/2019  WBC 4.0 - 10.5 K/uL 6.4  Hemoglobin 12.0 - 15.0 g/dL 12.9  Hematocrit 36.0 - 46.0 % 38.8  Platelets 150 - 400 K/uL 175    No results found for: HGBA1C No results found for: TSH  ==================================================  COVID-19 Education: The signs and symptoms of COVID-19 were discussed with the patient and how to seek care for testing (follow up  with PCP or arrange E-visit).    I spent a total of 25  minutes with the patient spent in direct patient consultation.  Additional time spent with chart review  / charting (studies, outside notes, etc): 17 min Total Time: 42 min  Current medicines are reviewed at length with the patient today.  (+/- concerns) not at present.  This visit occurred during the SARS-CoV-2 public health emergency.  Safety protocols were in place, including screening questions prior to the visit, additional usage of staff PPE, and extensive cleaning of exam room while observing appropriate contact time as indicated for disinfecting solutions.  Notice: This dictation was prepared with Dragon dictation along with smart phrase technology. Any transcriptional errors that result from this process are unintentional and may not be corrected upon review.   Studies Ordered:  No orders of the defined types were placed in this encounter.    Patient Instructions / Medication Changes & Studies & Tests Ordered   Patient Instructions  Medication Instructions:  -Your physician recommends that you continue on your current medications as directed. Please refer to the Current Medication list given to you today.  *If you need a refill on your cardiac medications before your next appointment, please call your pharmacy*   Lab Work: - none ordered  If you have labs (blood work) drawn today and your tests are completely normal, you will receive your results only by: [place] (if you  have MyChart) OR A paper copy in the mail If you have any lab test that is abnormal or we need to change your treatment, we will call you to review the results.   Testing/Procedures: - none ordered   Follow-Up: At [facility], you and your health needs are our priority.  As part of our continuing mission to provide you with exceptional heart care, we have created designated Provider Care Teams.  These Care Teams include your primary  Cardiologist (physician) and Advanced Practice Providers (APPs -  Physician Assistants and Nurse Practitioners) who all work together to provide you with the care you need, when you need it.  We recommend signing up for the patient portal called "MyChart".  Sign up information is provided on this After Visit Summary.  MyChart is used to connect with patients for Virtual Visits (Telemedicine).  Patients are able to view lab/test results, encounter notes, upcoming appointments, etc.  Non-urgent messages can be sent to your provider as well.   To learn more about what you can do with MyChart, go to [email or web address].    Your next appointment:   4-5 month(s)  The format for your next appointment:   In Person  Provider:   Glenetta Hew, MD    Other Instructions  1) If you start to feel dizzy/ nauseated/ flushed, please sit down, drink water, and eat something   1) Stay well hydrated - use Liquid IV or Pedialyte Advanced; G2 Gatorade or low sugar Powerade are also options.  2) Wear compression socks/ a tight fitting sock up to the knee  3) Try to elevate your lower extremities when sitting      Glenetta Hew, M.D., M.S. Interventional Cardiologist   Pager # [phone] Phone # [phone] [address] [place], [place] [address]   Thank you for choosing Heartcare at [facility]!!

## 2021-07-21 NOTE — Assessment & Plan Note (Signed)
Minimal bradycardia.  She is still relatively young and I do not see any significant bradycardia on her monitor.  Her smart watch said her heart rate was 48 today while I was seeing her, but on vital signs her heart rate was 64.  I explained to her that she would not pass out with a heart rate of 48, but we did need to be in the 30s for her to pass out.  As long as her heart rate goes up with exercise, she is okay.

## 2021-07-21 NOTE — Assessment & Plan Note (Signed)
Patient instructions are same as for vasovagal.  Also recommended when she feels lightheaded dizzy or nauseated but we want her to sit down and eat something or drink something. Continue to wear support stockings and stay very well-hydrated at baseline.  (Roughly 80 ounces a day) - use Liquid IV or Pedialyte Advanced; G2 Gatorade or low sugar Powerade are also options. Also discussed bilateral leg elevation.  Although I do not see any real venous stasis changes.

## 2021-07-26 ENCOUNTER — Ambulatory Visit: Payer: No Typology Code available for payment source | Admitting: Podiatry

## 2021-12-01 ENCOUNTER — Ambulatory Visit: Payer: No Typology Code available for payment source | Admitting: Cardiology

## 2021-12-08 ENCOUNTER — Ambulatory Visit: Payer: No Typology Code available for payment source | Admitting: Cardiology

## 2021-12-08 ENCOUNTER — Encounter: Payer: Self-pay | Admitting: Cardiology

## 2021-12-08 VITALS — BP 110/50 | HR 55 | Ht 64.0 in | Wt 139.8 lb

## 2021-12-08 DIAGNOSIS — R55 Syncope and collapse: Secondary | ICD-10-CM

## 2021-12-08 DIAGNOSIS — R42 Dizziness and giddiness: Secondary | ICD-10-CM

## 2021-12-08 DIAGNOSIS — R001 Bradycardia, unspecified: Secondary | ICD-10-CM | POA: Diagnosis not present

## 2021-12-08 NOTE — Patient Instructions (Signed)
Medication Instructions:  ? ?Your physician recommends that you continue on your current medications as directed. Please refer to the Current Medication list given to you today.  ? ?*If you need a refill on your cardiac medications before your next appointment, please call your pharmacy* ? ? ?Lab Work: ?None ordered ? ?If you have labs (blood work) drawn today and your tests are completely normal, you will receive your results only by: ?MyChart Message (if you have MyChart) OR ?A paper copy in the mail ?If you have any lab test that is abnormal or we need to change your treatment, we will call you to review the results. ? ? ?Testing/Procedures: ?None ordered ? ? ?Follow-Up: ?At [facility], you and your health needs are our priority.  As part of our continuing mission to provide you with exceptional heart care, we have created designated Provider Care Teams.  These Care Teams include your primary Cardiologist (physician) and Advanced Practice Providers (APPs -  Physician Assistants and Nurse Practitioners) who all work together to provide you with the care you need, when you need it. ? ?We recommend signing up for the patient portal called "MyChart".  Sign up information is provided on this After Visit Summary.  MyChart is used to connect with patients for Virtual Visits (Telemedicine).  Patients are able to view lab/test results, encounter notes, upcoming appointments, etc.  Non-urgent messages can be sent to your provider as well.   ?To learn more about what you can do with MyChart, go to [email or web address].   ? ?Your next appointment:   ?7-8 month(s) ? ?The format for your next appointment:   ?In Person ? ?Provider:   ?You may see Glenetta Hew, MD or one of the following Advanced Practice Providers on your designated Care Team:   ?Murray Hodgkins, NP ?Christell Faith, PA-C ?Cadence Kathlen Mody, PA-C ? ? ?Other Instructions ? ?Hydrate! Make sure you are getting plenty of fluids. Aim for 70-80 ounces a day.  ?

## 2021-12-08 NOTE — Progress Notes (Signed)
? ? ?Primary Care Provider: Cyndi Bender, PA-C ?[facility] Cardiologist: None ?Electrophysiologist: None ? ?Clinic Note: ?Chief Complaint  ?Patient presents with  ? Follow-up  ?  Still dizzy & lightheaded ?Dry mouth ?NO further syncope  ? ? ? ?=================================== ? ?ASSESSMENT/PLAN  ? ?Problem List Items Addressed This Visit   ? ?  ? Cardiology Problems  ? Bradycardia, sinus - Primary  ?  No notable bradycardia documented on monitor. ?Suspect is over bradycardia that was noted was probably due to vasovagal. ?  ?  ?  ? Other  ? Orthostatic dizziness  ?  Similar instructions to vasovagal.  Will provide letter for her that we will hopefully allow her to carry a water bottle with her at work to stay adequately hydrated. ?  ?  ? Vasovagal syncope (Chronic)  ?  No further episodes, however she does have tendency to be susceptible to that because of her positional dizziness. ?Benign cardiac evaluation. ? ?Plan: Continue recommend adequate hydration -at least 80 ounces of water a day, can supplement with liquid IV or Pedialyte, Gatorade low sugar ?During colder months, can consider wearing support stockings ?Elevate feet when possible. ?  ?  ?=================================== ? ?HPI:   ? ?Monica Woods is a 66 y.o. female who is being seen today for follow-up evaluation of Bradycardia and Syncope at the request of Cyndi Bender, Hershal Coria. ? ?Initial consult on June 09, 2021 at the request of Garlan Fair, [place].  In response to an episode of syncope back in September - per report ? "Bradycardic per EMS. Up until that May 2022, was walking ~1 hr in the part without difficulty - started to be limited by OA pain in knee & back.   Started having dizzy symptoms in May and June usually with standing up and certain movements.  She was noticing lightheadedness and dizziness but no syncope.  However, with the episode of syncope, she had been out to dinner with her sister-in-law, she felt queasy as though she  needs a bowel movement.  She then proceeded to have lightheadedness and wooziness with a sensation of flushing and had lost consciousness upon arrival to the house.  She was noted by EMS to be normoglycemic and ability normal EKG.  She did not go to the ER.  She had 1 episode somewhat similar to that since but did not pass out. ? ?Monica Woods was last seen in Nov 2022 the follow-up after her monitor, echo and carotid Dopplers.  No sustained bradycardia on monitor.  Rare PACs and PVCs.  Echo benign as were carotid Dopplers.  No further syncope.  Just some mild positional dizziness. ?Recommended aggressive hydration including liquid IV or Powerade/Gatorade.  They STEMI ounces a day.  Ensuring adequate p.o. intake.  Also avoiding triggers. ? ?Recent Hospitalizations: None ? ?Reviewed  CV studies:   ? ?The following studies were reviewed today: (if available, images/films reviewed: From Epic Chart or Care Everywhere) ?None ? ?Interval History:  ? ?Monica Woods presents for follow-up.  She has not had any further syncope but just complains of occasional dizziness and weakness.  She also has chronic dry mouth, but does wear a mask from work.  See active diabetes. ?Still mild positional dizziness but no other active cardiac symptoms.  Says that every now and then she will feel her heart rate go up but no real near syncopal symptoms. ? ?She denies any other cardiac symptoms or chest pain or pressure with rest or exertion.  No irregular palpitations.  No Syncope/near syncope or TIA/amaurosis fugax.Marland Kitchen  No PND, orthopnea or edema. ?  ? ?REVIEWED OF SYSTEMS  ? ?Review of Systems  ?Constitutional:  Positive for weight loss (Actually gaining weight). Negative for malaise/fatigue (Has been a little fearful since her syncopal episode.  Not exercising anymore.  Mostly limited by arthritis pains.).  ?HENT:  Negative for congestion, hearing loss and sinus pain.   ?Eyes:  Negative for pain and discharge. Blurred vision: With 1  episode of lightheadedness had a brief episode of blurred vision-difficult assess if this is amaurosis fugax. ?Respiratory:  Negative for cough and shortness of breath.   ?Cardiovascular:  Negative for palpitations and leg swelling (Trivial).  ?Gastrointestinal:  Negative for blood in stool, constipation (Not really constipation, just a sense of needing to have BM prior to syncope.) and melena.  ?Genitourinary:  Negative for hematuria.  ?Musculoskeletal:  Positive for joint pain (Bilateral knee pain, now limiting her activity.  Also plan to fasciitis treated with injections and brace.) and myalgias (Pretty significant cramps). Negative for back pain and neck pain.  ?     R shin hurts with standing.   ?Neurological:  Positive for dizziness. Negative for loss of consciousness (Per HPI) and headaches.  ?     Lightheadedness, orthostatic dizziness-associated with sensation of needing to have BM.  ?Endo/Heme/Allergies:  Positive for environmental allergies.  ?Psychiatric/Behavioral:  Negative for depression and memory loss. The patient is nervous/anxious and has insomnia.   ? ?I have reviewed and (if needed) personally updated the patient's problem list, medications, allergies, past medical and surgical history, social and family history.  ? ?PAST MEDICAL HISTORY  ? ?Past Medical History:  ?Diagnosis Date  ? High cholesterol   ? Had been on Lovaza and Crestor, but she did not realize that she is will stay on these.  No longer taking them.  ? Pre-diabetes   ? ? ?PAST SURGICAL HISTORY  ? ?Past Surgical History:  ?Procedure Laterality Date  ? 14-day Zio Patch Monitor  06/2019  ? Predominantly NSR: Rate range 45-118 bpm (minimum heart rate at 6 AM, maximum heart rate 6:30 PM).  No sustained bradycardia., average 67 bpm.  Rare isolated PACs and PVCs. No arrhythmia either tachycardic or bradycardic: No atrial fibrillation, atrial flutter, sustained SVT or PAT (supraventricular or paroxysmal atrial tachycardia).  ? 24-hour  Holter Monitor  12/2014  ? Heart rate range 51-00 beats minute.  Average 67 bpm.  2 PVCs noted.  2 short bursts of SVT  ? BREAST BIOPSY Right 04/02/2014  ? neg stereo for FIBROADENOMATOUS CHANGES WITH SCLEROSIS AND STROMAL   ? STRESS ECHOCARDIOGRAM  12/2014  ? [facility]): Resting and stress EF > 55%.  Normal contraction.  Mild MR, mild TR.  ? TRANSTHORACIC ECHOCARDIOGRAM  07/20/2021  ? [facility]: EF 55 to 60%.  Normal wall motion.  Normal diastolic parameters.  Normal RV.  Normal aortic and mitral valve.  (Normal study)  ? ?Carotid Dopplers 07/20/2021: Formal read pending.  Does not appear to have significant carotid disease or vertebral artery disease.  It does appear to be turbulent flow in the Left subclavian artery. ? ? ?There is no immunization history on file for this patient. ? ?MEDICATIONS/ALLERGIES  ? ?Current Meds  ?Medication Sig  ? Beta Carotene (VITAMIN A) 25000 UNIT capsule Take 8,000 Units by mouth daily.  ? brimonidine (ALPHAGAN) 0.2 % ophthalmic solution SMARTSIG:In Eye(s)  ? diphenhydrAMINE (BENADRYL) 25 mg capsule Take 25 mg by mouth every 6 (six) hours  as needed.  ? latanoprost (XALATAN) 0.005 % ophthalmic solution SMARTSIG:In Eye(s)  ? magnesium 30 MG tablet Take 250 mg by mouth daily.  ? Multiple Vitamin (MULTIVITAMIN) capsule Take 1 capsule by mouth daily.  ? omega-3 acid ethyl esters (LOVAZA) 1 g capsule Take by mouth daily.  ? vitamin E 400 UNIT capsule Take 400 Units by mouth daily.  ? ? ?Allergies  ?Allergen Reactions  ? Levonorgestrel-Ethinyl Estrad   ? ? ?SOCIAL HISTORY/FAMILY HISTORY  ? ?Reviewed in Epic:  ? ?Social History  ? ?Tobacco Use  ? Smoking status: Never  ? Smokeless tobacco: Never  ?Vaping Use  ? Vaping Use: Never used  ?Substance Use Topics  ? Alcohol use: Never  ?  Alcohol/week: 0.0 standard drinks  ? Drug use: Never  ? ?Social History  ? ?Social History Narrative  ? Not on file  ? ?Family History  ?Problem Relation Age of Onset  ? Hypertension Mother    ? Stroke Mother   ? Hypertension Father   ? Breast cancer Neg Hx   ? ? ?OBJCTIVE -PE, EKG, labs  ? ?Wt Readings from Last 3 Encounters:  ?12/08/21 139 lb 12.8 oz (63.4 kg)  ?07/21/21 146 lb (66.2 kg)

## 2021-12-09 ENCOUNTER — Encounter: Payer: Self-pay | Admitting: Cardiology

## 2021-12-09 NOTE — Assessment & Plan Note (Signed)
Similar instructions to vasovagal.  Will provide letter for her that we will hopefully allow her to carry a water bottle with her at work to stay adequately hydrated. ?

## 2021-12-09 NOTE — Assessment & Plan Note (Signed)
No further episodes, however she does have tendency to be susceptible to that because of her positional dizziness. ?Benign cardiac evaluation. ? ?Plan: Continue recommend adequate hydration -at least 80 ounces of water a day, can supplement with liquid IV or Pedialyte, Gatorade low sugar ?During colder months, can consider wearing support stockings ?Elevate feet when possible. ?

## 2021-12-09 NOTE — Assessment & Plan Note (Signed)
No notable bradycardia documented on monitor. ?Suspect is over bradycardia that was noted was probably due to vasovagal. ?

## 2022-07-06 ENCOUNTER — Ambulatory Visit: Payer: Self-pay | Admitting: Urology

## 2022-07-19 ENCOUNTER — Encounter: Payer: Self-pay | Admitting: Urology

## 2022-07-19 ENCOUNTER — Ambulatory Visit: Payer: No Typology Code available for payment source | Admitting: Urology

## 2022-07-19 VITALS — BP 123/67 | HR 68 | Ht 64.0 in | Wt 138.0 lb

## 2022-07-19 DIAGNOSIS — R310 Gross hematuria: Secondary | ICD-10-CM

## 2022-07-19 LAB — URINALYSIS, COMPLETE
Bilirubin, UA: NEGATIVE
Glucose, UA: NEGATIVE
Ketones, UA: NEGATIVE
Nitrite, UA: NEGATIVE
Specific Gravity, UA: 1.03 (ref 1.005–1.030)
Urobilinogen, Ur: 1 mg/dL (ref 0.2–1.0)
pH, UA: 5.5 (ref 5.0–7.5)

## 2022-07-19 LAB — MICROSCOPIC EXAMINATION: WBC, UA: >30 /hpf — AB (ref 0–5)

## 2022-07-19 NOTE — Progress Notes (Signed)
07/19/2022 2:04 PM   Monica Woods August 09, 1956 [id number]  Referring provider: Cyndi Bender, PA-C [address] [place],  [place] [address]  Chief Complaint  Patient presents with   Hematuria    HPI: Monica Woods is a 66 y.o. female referred for evaluation of hematuria.  A Spanish interpreter was present via video link.  Forwarded records did not include any UA results She states she recently saw her PCP with complaints of small amounts of blood in her urine with a pinkish tint and pinkish tinge on occasion when wiping She states she was started on antibiotics however was contacted a few days later stating she did not have an infection and was recommended she stop the antibiotics No bothersome LUTS No flank, abdominal or pelvic pain Her main complaint is a vaginal discharge and she states she is scheduled to see a gynecologist early next month  PMH: Past Medical History:  Diagnosis Date   High cholesterol    Had been on Lovaza and Crestor, but she did not realize that she is will stay on these.  No longer taking them.   Pre-diabetes     Surgical History: Past Surgical History:  Procedure Laterality Date   14-day Zio Patch Monitor  06/2019   Predominantly NSR: Rate range 45-118 bpm (minimum heart rate at 6 AM, maximum heart rate 6:30 PM).  No sustained bradycardia., average 67 bpm.  Rare isolated PACs and PVCs. No arrhythmia either tachycardic or bradycardic: No atrial fibrillation, atrial flutter, sustained SVT or PAT (supraventricular or paroxysmal atrial tachycardia).   24-hour Holter Monitor  12/2014   Heart rate range 51-00 beats minute.  Average 67 bpm.  2 PVCs noted.  2 short bursts of SVT   BREAST BIOPSY Right 04/02/2014   neg stereo for FIBROADENOMATOUS CHANGES WITH SCLEROSIS AND STROMAL    STRESS ECHOCARDIOGRAM  12/2014   [facility]): Resting and stress EF > 55%.  Normal contraction.  Mild MR, mild TR.   TRANSTHORACIC ECHOCARDIOGRAM   07/20/2021   [facility]: EF 55 to 60%.  Normal wall motion.  Normal diastolic parameters.  Normal RV.  Normal aortic and mitral valve.  (Normal study)    Home Medications:  Allergies as of 07/19/2022       Reactions   Levonorgestrel-ethinyl Estrad         Medication List        Accurate as of July 19, 2022  2:04 PM. If you have any questions, ask your nurse or doctor.          STOP taking these medications    diphenhydrAMINE 25 mg capsule Commonly known as: BENADRYL Stopped by: Abbie Sons, MD   folic acid 616 MCG tablet Commonly known as: FOLVITE Stopped by: Abbie Sons, MD   meloxicam 15 MG tablet Commonly known as: MOBIC Stopped by: Abbie Sons, MD   ondansetron 8 MG disintegrating tablet Commonly known as: Zofran ODT Stopped by: Abbie Sons, MD   oxyCODONE-acetaminophen 5-325 MG tablet Commonly known as: Percocet Stopped by: Abbie Sons, MD   rosuvastatin 10 MG tablet Commonly known as: CRESTOR Stopped by: Abbie Sons, MD       TAKE these medications    brimonidine 0.2 % ophthalmic solution Commonly known as: ALPHAGAN SMARTSIG:In Eye(s)   latanoprost 0.005 % ophthalmic solution Commonly known as: XALATAN SMARTSIG:In Eye(s)   magnesium 30 MG tablet Take 250 mg by mouth daily.   multivitamin capsule Take 1 capsule by  mouth daily.   omega-3 acid ethyl esters 1 g capsule Commonly known as: LOVAZA Take by mouth daily.   vitamin A 25000 UNIT capsule Take 8,000 Units by mouth daily.   vitamin E 180 MG (400 UNITS) capsule Take 400 Units by mouth daily.        Allergies:  Allergies  Allergen Reactions   Levonorgestrel-Ethinyl Estrad     Family History: Family History  Problem Relation Age of Onset   Hypertension Mother    Stroke Mother    Hypertension Father    Breast cancer Neg Hx     Social History:  reports that she has never smoked. She has never used smokeless tobacco. She reports  that she does not drink alcohol and does not use drugs.   Physical Exam: BP 123/67   Pulse 68   Ht '5\' 4"'$  (1.626 m)   Wt 138 lb (62.6 kg)   LMP  (LMP Unknown)   BMI 23.69 kg/m   Constitutional:  Alert, No acute distress. HEENT: [place] AT Respiratory: Normal respiratory effort, no increased work of breathing. Psychiatric: Normal mood and affect.  Laboratory Data:  Urinalysis Dipstick 2+ blood/1+ protein/1+ leukocytes Microscopy >30 WBC/3-10 RBC/many bacteria   Assessment & Plan:    1. Gross hematuria Has noted a pink tinged urine at times and occasional blood on toilet paper when wiping We discussed the recommended evaluation of gross hematuria would include a CT urogram for upper tract evaluation and cystoscopy for lower tract evaluation. She wanted to hold off on scheduling until she had her gynecology appointment Urine culture ordered though she is asymptomatic and would not recommend treating asymptomatic bacteriuria   Abbie Sons, MD  [place] [address] [place], [place] [address] [phone]

## 2022-07-22 LAB — CULTURE, URINE COMPREHENSIVE

## 2022-07-23 ENCOUNTER — Encounter: Payer: Self-pay | Admitting: Urology

## 2022-07-25 ENCOUNTER — Ambulatory Visit
Admission: RE | Admit: 2022-07-25 | Discharge: 2022-07-25 | Disposition: A | Discharge status: Home / Self Care | Payer: No Typology Code available for payment source | Source: Ambulatory Visit | Attending: Gastroenterology | Admitting: Gastroenterology

## 2022-07-25 ENCOUNTER — Encounter: Admission: RE | Payer: Self-pay | Source: Home / Self Care

## 2022-07-25 ENCOUNTER — Encounter: Payer: Self-pay | Admitting: *Deleted

## 2022-07-25 ENCOUNTER — Ambulatory Visit: Payer: No Typology Code available for payment source | Admitting: Anesthesiology

## 2022-07-25 ENCOUNTER — Ambulatory Visit: Admission: RE | Admit: 2022-07-25 | Payer: No Typology Code available for payment source | Source: Home / Self Care

## 2022-07-25 ENCOUNTER — Encounter
Admission: RE | Disposition: A | Discharge status: Home / Self Care | Payer: Self-pay | Source: Ambulatory Visit | Attending: Gastroenterology

## 2022-07-25 DIAGNOSIS — E7800 Pure hypercholesterolemia, unspecified: Secondary | ICD-10-CM | POA: Insufficient documentation

## 2022-07-25 DIAGNOSIS — K5730 Diverticulosis of large intestine without perforation or abscess without bleeding: Secondary | ICD-10-CM | POA: Insufficient documentation

## 2022-07-25 DIAGNOSIS — R7303 Prediabetes: Secondary | ICD-10-CM | POA: Insufficient documentation

## 2022-07-25 DIAGNOSIS — Z1211 Encounter for screening for malignant neoplasm of colon: Secondary | ICD-10-CM | POA: Insufficient documentation

## 2022-07-25 DIAGNOSIS — K640 First degree hemorrhoids: Secondary | ICD-10-CM | POA: Insufficient documentation

## 2022-07-25 DIAGNOSIS — D123 Benign neoplasm of transverse colon: Secondary | ICD-10-CM | POA: Insufficient documentation

## 2022-07-25 HISTORY — PX: COLONOSCOPY: SHX5424

## 2022-07-25 LAB — CULTURE, URINE COMPREHENSIVE

## 2022-07-25 SURGERY — COLONOSCOPY WITH PROPOFOL
Anesthesia: General

## 2022-07-25 SURGERY — COLONOSCOPY
Anesthesia: General

## 2022-07-25 MED ORDER — PROPOFOL 500 MG/50ML IV EMUL
INTRAVENOUS | Status: DC | PRN
  Administered 2022-07-25: 150 ug/kg/min via INTRAVENOUS

## 2022-07-25 MED ORDER — GLYCOPYRROLATE 0.2 MG/ML IJ SOLN
INTRAMUSCULAR | Status: DC | PRN
  Administered 2022-07-25: .2 mg via INTRAVENOUS

## 2022-07-25 MED ORDER — PROPOFOL 10 MG/ML IV BOLUS
INTRAVENOUS | Status: DC | PRN
  Administered 2022-07-25: 60 mg via INTRAVENOUS

## 2022-07-25 MED ORDER — SODIUM CHLORIDE 0.9 % IV SOLN: INTRAVENOUS | Status: DC

## 2022-07-25 MED ORDER — GLYCOPYRROLATE 0.2 MG/ML IJ SOLN
INTRAMUSCULAR | Status: AC
  Filled 2022-07-25: qty 1

## 2022-07-25 MED ORDER — PHENYLEPHRINE 80 MCG/ML (10ML) SYRINGE FOR IV PUSH (FOR BLOOD PRESSURE SUPPORT)
PREFILLED_SYRINGE | INTRAVENOUS | Status: DC | PRN
  Administered 2022-07-25: 80 ug via INTRAVENOUS

## 2022-07-25 MED ORDER — LIDOCAINE HCL (CARDIAC) PF 100 MG/5ML IV SOSY
PREFILLED_SYRINGE | INTRAVENOUS | Status: DC | PRN
  Administered 2022-07-25: 50 mg via INTRAVENOUS

## 2022-07-25 NOTE — Anesthesia Preprocedure Evaluation (Signed)
Anesthesia Evaluation  Patient identified by MRN, date of birth, ID band Patient awake    Reviewed: Allergy & Precautions, NPO status , Patient's Chart, lab work & pertinent test results  Airway Mallampati: II  TM Distance: >3 FB Neck ROM: full    Dental  (+) Teeth Intact   Pulmonary neg pulmonary ROS   Pulmonary exam normal breath sounds clear to auscultation       Cardiovascular Exercise Tolerance: Good negative cardio ROS Normal cardiovascular exam Rhythm:Regular     Neuro/Psych negative neurological ROS  negative psych ROS   GI/Hepatic negative GI ROS, Neg liver ROS,,,  Endo/Other  negative endocrine ROS    Renal/GU negative Renal ROS  negative genitourinary   Musculoskeletal negative musculoskeletal ROS (+)    Abdominal Normal abdominal exam  (+)   Peds negative pediatric ROS (+)  Hematology negative hematology ROS (+)   Anesthesia Other Findings Past Medical History: No date: High cholesterol     Comment:  Had been on Lovaza and Crestor, but she did not realize               that she is will stay on these.  No longer taking them. No date: Pre-diabetes  Past Surgical History: 06/2019: 14-day Zio Patch Monitor     Comment:  Predominantly NSR: Rate range 45-118 bpm (minimum heart               rate at 6 AM, maximum heart rate 6:30 PM).  No sustained               bradycardia., average 67 bpm.  Rare isolated PACs and               PVCs. No arrhythmia either tachycardic or bradycardic: No              atrial fibrillation, atrial flutter, sustained SVT or PAT              (supraventricular or paroxysmal atrial tachycardia). 12/2014: 24-hour Holter Monitor     Comment:  Heart rate range 51-00 beats minute.  Average 67 bpm.  2              PVCs noted.  2 short bursts of SVT 04/02/2014: BREAST BIOPSY; Right     Comment:  neg stereo for FIBROADENOMATOUS CHANGES WITH SCLEROSIS               AND STROMAL   12/2014: STRESS ECHOCARDIOGRAM     Comment:  ([facility]): Resting and stress EF > 55%.                Normal contraction.  Mild MR, mild TR. 07/20/2021: TRANSTHORACIC ECHOCARDIOGRAM     Comment:  [facility]: EF 55 to 60%.  Normal wall motion.              Normal diastolic parameters.  Normal RV.  Normal aortic               and mitral valve.  (Normal study)  BMI    Body Mass Index: 23.52 kg/m      Reproductive/Obstetrics negative OB ROS                             Anesthesia Physical Anesthesia Plan  ASA: 2  Anesthesia Plan: General   Post-op Pain Management:    Induction: Intravenous  PONV Risk Score and Plan: Propofol infusion and TIVA  Airway Management Planned: Natural Airway  Additional Equipment:   Intra-op Plan:   Post-operative Plan:   Informed Consent: I have reviewed the patients History and Physical, chart, labs and discussed the procedure including the risks, benefits and alternatives for the proposed anesthesia with the patient or authorized representative who has indicated his/her understanding and acceptance.     Dental Advisory Given  Plan Discussed with: CRNA and Surgeon  Anesthesia Plan Comments:        Anesthesia Quick Evaluation

## 2022-07-25 NOTE — Op Note (Signed)
[facility] Patient Name: Monica Woods Procedure Date: 07/25/2022 9:20 AM MRN: [id number] Account #: [id number] Date of Birth: 31-May-1956 Admit Type: Outpatient Age: 66 Room: [facility] ENDO ROOM 1 Gender: Female Note Status: Finalized Instrument Name: Park Meo [id number] Procedure:             Colonoscopy Indications:           Screening for colorectal malignant neoplasm Providers:             Andrey Farmer MD, MD Medicines:             Monitored Anesthesia Care Complications:         No immediate complications. Estimated blood loss:                         Minimal. Procedure:             Pre-Anesthesia Assessment:                        - Prior to the procedure, a History and Physical was                         performed, and patient medications and allergies were                         reviewed. The patient is competent. The risks and                         benefits of the procedure and the sedation options and                         risks were discussed with the patient. All questions                         were answered and informed consent was obtained.                         Patient identification and proposed procedure were                         verified by the physician, the nurse, the                         anesthesiologist, the anesthetist and the technician                         in the endoscopy suite. Mental Status Examination:                         alert and oriented. Airway Examination: normal                         oropharyngeal airway and neck mobility. Respiratory                         Examination: clear to auscultation. CV Examination:                         normal. Prophylactic Antibiotics: The patient does not  require prophylactic antibiotics. Prior                         Anticoagulants: The patient has taken no anticoagulant                         or antiplatelet agents. ASA Grade  Assessment: II - A                         patient with mild systemic disease. After reviewing                         the risks and benefits, the patient was deemed in                         satisfactory condition to undergo the procedure. The                         anesthesia plan was to use monitored anesthesia care                         (MAC). Immediately prior to administration of                         medications, the patient was re-assessed for adequacy                         to receive sedatives. The heart rate, respiratory                         rate, oxygen saturations, blood pressure, adequacy of                         pulmonary ventilation, and response to care were                         monitored throughout the procedure. The physical                         status of the patient was re-assessed after the                         procedure.                        After obtaining informed consent, the colonoscope was                         passed under direct vision. Throughout the procedure,                         the patient's blood pressure, pulse, and oxygen                         saturations were monitored continuously. The                         Colonoscope was introduced through the anus and  advanced to the the cecum, identified by appendiceal                         orifice and ileocecal valve. The colonoscopy was                         performed without difficulty. The patient tolerated                         the procedure well. The quality of the bowel                         preparation was good. The ileocecal valve, appendiceal                         orifice, and rectum were photographed. Findings:      The perianal and digital rectal examinations were normal.      A 3 mm polyp was found in the proximal transverse colon. The polyp was       sessile. The polyp was removed with a cold snare. Resection and       retrieval were  complete. Estimated blood loss was minimal.      Scattered small-mouthed diverticula were found in the sigmoid colon,       descending colon, transverse colon and ascending colon.      Internal hemorrhoids were found during retroflexion. The hemorrhoids       were Grade I (internal hemorrhoids that do not prolapse).      The exam was otherwise without abnormality on direct and retroflexion       views. Impression:            - One 3 mm polyp in the proximal transverse colon,                         removed with a cold snare. Resected and retrieved.                        - Diverticulosis in the sigmoid colon, in the                         descending colon, in the transverse colon and in the                         ascending colon.                        - Internal hemorrhoids.                        - The examination was otherwise normal on direct and                         retroflexion views. Recommendation:        - Discharge patient to home.                        - Resume previous diet.                        - Continue present medications.                        -  Await pathology results.                        - Repeat colonoscopy in 7 years for surveillance.                        - Return to referring physician as previously                         scheduled. Procedure Code(s):     --- Professional ---                        [phone], Colonoscopy, flexible; with removal of                         tumor(s), polyp(s), or other lesion(s) by snare                         technique Diagnosis Code(s):     --- Professional ---                        Z12.11, Encounter for screening for malignant neoplasm                         of colon                        D12.3, Benign neoplasm of transverse colon (hepatic                         flexure or splenic flexure)                        K64.0, First degree hemorrhoids                        K57.30, Diverticulosis of large intestine  without                         perforation or abscess without bleeding CPT copyright 2022 American Medical Association. All rights reserved. The codes documented in this report are preliminary and upon coder review may  be revised to meet current compliance requirements. Andrey Farmer MD, MD 07/25/2022 9:47:43 AM Number of Addenda: 0 Note Initiated On: 07/25/2022 9:20 AM Scope Withdrawal Time: 0 hours 7 minutes 46 seconds  Total Procedure Duration: 0 hours 11 minutes 41 seconds  Estimated Blood Loss:  Estimated blood loss was minimal.      [facility]

## 2022-07-25 NOTE — Anesthesia Postprocedure Evaluation (Signed)
Anesthesia Post Note  Patient: Monica Woods  Procedure(s) Performed: COLONOSCOPY  Patient location during evaluation: PACU Anesthesia Type: General Level of consciousness: awake Pain management: pain level controlled Vital Signs Assessment: post-procedure vital signs reviewed and stable Respiratory status: spontaneous breathing and nonlabored ventilation Cardiovascular status: stable Anesthetic complications: no  No notable events documented.   Last Vitals:  Vitals:   07/25/22 0959 07/25/22 1009  BP: (!) 124/53 (!) 142/68  Pulse: 82 69  Resp: 15 16  Temp:    SpO2: 100% 99%    Last Pain:  Vitals:   07/25/22 1009  TempSrc:   PainSc: 0-No pain                 VAN STAVEREN,Terri Rorrer

## 2022-07-25 NOTE — Transfer of Care (Signed)
Immediate Anesthesia Transfer of Care Note  Patient: Monica Woods  Procedure(s) Performed: COLONOSCOPY  Patient Location: PACU  Anesthesia Type:General  Level of Consciousness: drowsy  Airway & Oxygen Therapy: Patient Spontanous Breathing  Post-op Assessment: Report given to RN and Post -op Vital signs reviewed and stable  Post vital signs: Reviewed and stable  Last Vitals:  Vitals Value Taken Time  BP 100/56 07/25/22 0949  Temp 35.8 0949  Pulse 79 07/25/22 0950  Resp 15 07/25/22 0950  SpO2 99 % 07/25/22 0950  Vitals shown include unvalidated device data.  Last Pain:  Vitals:   07/25/22 0900  TempSrc: Temporal  PainSc: 1          Complications: No notable events documented.

## 2022-07-25 NOTE — H&P (Signed)
Outpatient short stay form Pre-procedure 07/25/2022  Lesly Rubenstein, MD  Primary Physician: Cyndi Bender, PA-C  Reason for visit:  Screening  History of present illness:    66 y/o lady with no significant PMH here for screening colonoscopy. Last colonoscopy 10 years ago was normal. No blood thinners. No family history of GI malignancies. No significant abdominal surgeries.    Current Facility-Administered Medications:    0.9 %  sodium chloride infusion, , Intravenous, Continuous, Aryav Wimberly, Hilton Cork, MD  Medications Prior to Admission  Medication Sig Dispense Refill Last Dose   Beta Carotene (VITAMIN A) 25000 UNIT capsule Take 8,000 Units by mouth daily.   Past Week   brimonidine (ALPHAGAN) 0.2 % ophthalmic solution SMARTSIG:In Eye(s)   07/24/2022   latanoprost (XALATAN) 0.005 % ophthalmic solution SMARTSIG:In Eye(s)   07/24/2022   magnesium 30 MG tablet Take 250 mg by mouth daily.   Past Week   Multiple Vitamin (MULTIVITAMIN) capsule Take 1 capsule by mouth daily.   Past Week   omega-3 acid ethyl esters (LOVAZA) 1 g capsule Take by mouth daily.   Past Week   vitamin E 400 UNIT capsule Take 400 Units by mouth daily.   Past Week     Allergies  Allergen Reactions   Levonorgestrel-Ethinyl Estrad      Past Medical History:  Diagnosis Date   High cholesterol    Had been on Lovaza and Crestor, but she did not realize that she is will stay on these.  No longer taking them.   Pre-diabetes     Review of systems:  Otherwise negative.    Physical Exam  Gen: Alert, oriented. Appears stated age.  HEENT: PERRLA. Lungs: No respiratory distress CV: RRR Abd: soft, benign, no masses Ext: No edema    Planned procedures: Proceed with colonoscopy. The patient understands the nature of the planned procedure, indications, risks, alternatives and potential complications including but not limited to bleeding, infection, perforation, damage to internal organs and possible  oversedation/side effects from anesthesia. The patient agrees and gives consent to proceed.  Please refer to procedure notes for findings, recommendations and patient disposition/instructions.     Lesly Rubenstein, MD [facility]

## 2022-07-25 NOTE — Interval H&P Note (Signed)
History and Physical Interval Note:  07/25/2022 9:11 AM  Monica Woods  has presented today for surgery, with the diagnosis of Screening.  The various methods of treatment have been discussed with the patient and family. After consideration of risks, benefits and other options for treatment, the patient has consented to  Procedure(s): COLONOSCOPY (N/A) as a surgical intervention.  The patient's history has been reviewed, patient examined, no change in status, stable for surgery.  I have reviewed the patient's chart and labs.  Questions were answered to the patient's satisfaction.     Lesly Rubenstein  Ok to proceed with colonoscopy

## 2022-07-26 ENCOUNTER — Encounter: Payer: Self-pay | Admitting: Gastroenterology

## 2022-07-26 ENCOUNTER — Telehealth: Payer: Self-pay | Admitting: *Deleted

## 2022-07-26 LAB — SURGICAL PATHOLOGY

## 2022-07-26 NOTE — Telephone Encounter (Signed)
Left message in voice mail

## 2022-07-26 NOTE — Telephone Encounter (Signed)
-----   Message from Abbie Sons, MD sent at 07/26/2022 10:03 AM EST ----- Urine culture was negative for infection-needs an interpreter

## 2022-10-04 ENCOUNTER — Encounter: Payer: Self-pay | Admitting: Neurology

## 2022-10-04 DIAGNOSIS — M542 Cervicalgia: Secondary | ICD-10-CM

## 2023-05-03 ENCOUNTER — Other Ambulatory Visit: Payer: Self-pay | Admitting: Physician Assistant

## 2023-05-03 DIAGNOSIS — R42 Dizziness and giddiness: Secondary | ICD-10-CM

## 2023-05-09 ENCOUNTER — Ambulatory Visit
Admission: RE | Admit: 2023-05-09 | Discharge: 2023-05-09 | Disposition: A | Discharge status: Home / Self Care | Payer: No Typology Code available for payment source | Source: Ambulatory Visit | Attending: Physician Assistant | Admitting: Physician Assistant

## 2023-05-09 DIAGNOSIS — R42 Dizziness and giddiness: Secondary | ICD-10-CM | POA: Insufficient documentation

## 2023-05-19 ENCOUNTER — Emergency Department: Payer: No Typology Code available for payment source

## 2023-05-19 ENCOUNTER — Other Ambulatory Visit: Payer: Self-pay

## 2023-05-19 ENCOUNTER — Emergency Department
Admission: EM | Admit: 2023-05-19 | Discharge: 2023-05-19 | Disposition: A | Discharge status: Home / Self Care | Payer: No Typology Code available for payment source | Attending: Emergency Medicine | Admitting: Emergency Medicine

## 2023-05-19 DIAGNOSIS — U071 COVID-19: Secondary | ICD-10-CM | POA: Insufficient documentation

## 2023-05-19 DIAGNOSIS — E860 Dehydration: Secondary | ICD-10-CM | POA: Insufficient documentation

## 2023-05-19 DIAGNOSIS — R55 Syncope and collapse: Secondary | ICD-10-CM

## 2023-05-19 DIAGNOSIS — J029 Acute pharyngitis, unspecified: Secondary | ICD-10-CM | POA: Diagnosis present

## 2023-05-19 LAB — RESP PANEL BY RT-PCR (FLU A&B, COVID) ARPGX2
Influenza A by PCR: NEGATIVE
Influenza B by PCR: NEGATIVE
SARS Coronavirus 2 by RT PCR: POSITIVE — AB

## 2023-05-19 LAB — BASIC METABOLIC PANEL
Anion gap: 8 (ref 5–15)
BUN: 15 mg/dL (ref 8–23)
CO2: 24 mmol/L (ref 22–32)
Calcium: 7.8 mg/dL — ABNORMAL LOW (ref 8.9–10.3)
Chloride: 105 mmol/L (ref 98–111)
Creatinine, Ser: 0.59 mg/dL (ref 0.44–1.00)
GFR, Estimated: >60 mL/min (ref >60)
Glucose, Bld: 212 mg/dL — ABNORMAL HIGH (ref 70–99)
Potassium: 4.1 mmol/L (ref 3.5–5.1)
Sodium: 137 mmol/L (ref 135–145)

## 2023-05-19 LAB — CBC
HCT: 34.2 % — ABNORMAL LOW (ref 36.0–46.0)
Hemoglobin: 11.2 g/dL — ABNORMAL LOW (ref 12.0–15.0)
MCH: 31.4 pg (ref 26.0–34.0)
MCHC: 32.7 g/dL (ref 30.0–36.0)
MCV: 95.8 fL (ref 80.0–100.0)
Platelets: 156 10*3/uL (ref 150–400)
RBC: 3.57 MIL/uL — ABNORMAL LOW (ref 3.87–5.11)
RDW: 12.3 % (ref 11.5–15.5)
WBC: 6.1 10*3/uL (ref 4.0–10.5)
nRBC: 0 % (ref 0.0–0.2)

## 2023-05-19 LAB — URINALYSIS, ROUTINE W REFLEX MICROSCOPIC
Bacteria, UA: NONE SEEN
Bilirubin Urine: NEGATIVE
Glucose, UA: 50 mg/dL — AB
Ketones, ur: NEGATIVE mg/dL
Nitrite: NEGATIVE
Protein, ur: NEGATIVE mg/dL
Specific Gravity, Urine: 1.011 (ref 1.005–1.030)
pH: 6 (ref 5.0–8.0)

## 2023-05-19 MED ORDER — SODIUM CHLORIDE 0.9 % IV BOLUS
1000.0000 mL | Freq: Once | INTRAVENOUS | Status: AC
  Administered 2023-05-19: 1000 mL via INTRAVENOUS

## 2023-05-19 NOTE — ED Notes (Signed)
Patient to CT at this time

## 2023-05-19 NOTE — ED Notes (Addendum)
Pt came in via EMS from home due to a syncopal episode today and says this is her fourth episode of this year and she did hit her head and says she had a LOC. She had a previous syncopal episode on the 27th for which she went and saw her primary care doctor and says each episode seems to be more severe. She had an MRI completed which she stated they said was negative.States she has a little knee pain on her L knee from falling but denies any other pain. Also says she was yesterday + for COVID and put on medicine for it but cannot remember what medicine.

## 2023-05-19 NOTE — ED Triage Notes (Signed)
Pt comes via EMS from home with c/o syncopal episode. Pt has hx of dizziness and has had recent MRI which was neg. Pt was hypotensive with EMS. IV started and fluids given. Pt states she was eating breakfast and started to have some neck pain and got sweaty then passed out.   Pt states she went ahead and laid down because she felt she was going to pass out.

## 2023-05-19 NOTE — ED Provider Notes (Signed)
[facility] Provider Note    Event Date/Time   First MD Initiated Contact with Patient 05/19/23 1134     (approximate)   History   Dizziness  Spanish video interpreter utilized, of note the patient also speaks English quite well.  Interpreter present for much of the discussion for any points of clarification, but primarily patient speaks in English  HPI  Monica Woods is a 67 y.o. female patient has a history of prediabetes and hyperlipidemia  Patient relates that she passed out this morning.  She got up, while she was standing she started feeling lightheaded she does report she has had a few days of a slight sore scratchy throat and runny nose.  She tested positive for COVID and her primary care physician started her on Paxlovid yesterday.  She has initiated this treatment  She is not having fevers or chills no nausea or vomiting.  Slight loose stool.  No chest pain no shortness of breath no palpitations.  She reports that she has passed out probably 4 times in the last year or so.  It occurs while she is standing the first time occurring while she was at work and she struck her head on concrete.  This time she was at her home and recognize the symptoms of lightheadedness causing her to go to the couch and she laid down and briefly passed out while on the floor.  She did not fall or strike her head.  She reports very clearly that she did not hit her head she is not having a headache no neck pain.  She has no history of blood clots.  She has no chest pain.  She has followed up with cardiologist in the past when she has had an episode of passing out and has been told everything checked out okay  She reports staying relatively well-hydrated.  Has had slight loose stool.  No difficulty breathing.  Mild body aches     Physical Exam   Triage Vital Signs: ED Triage Vitals  Encounter Vitals Group     BP 05/19/23 0934 (!) 111/57     Systolic BP Percentile --       Diastolic BP Percentile --      Pulse Rate 05/19/23 0934 60     Resp 05/19/23 1125 16     Temp 05/19/23 0934 98 F (36.7 C)     Temp Source 05/19/23 1125 Oral     SpO2 05/19/23 0934 97 %     Weight --      Height --      Head Circumference --      Peak Flow --      Pain Score 05/19/23 0933 0     Pain Loc --      Pain Education --      Exclude from Growth Chart --     Most recent vital signs: Vitals:   05/19/23 1215 05/19/23 1426  BP: (!) 147/69 (!) 150/63  Pulse: 64 (!) 56  Resp:  16  Temp:  98 F (36.7 C)  SpO2: 99% 98%     General: Awake, no distress.  Fully oriented.  Normocephalic atraumatic very pleasant without distress Posterior oropharynx appears normal.  No stridor.  Moist mucous membranes CV:  Good peripheral perfusion.  Normal tones and rate Resp:  Normal effort.  Clear bilateral with normal work of breathing Abd:  No distention.  Soft nontender nondistended throughout Other:  Warm well-perfused extremities   ED  Results / Procedures / Treatments   Labs (all labs ordered are listed, but only abnormal results are displayed) Labs Reviewed  RESP PANEL BY RT-PCR (FLU A&B, COVID) ARPGX2 - Abnormal; Notable for the following components:      Result Value   SARS Coronavirus 2 by RT PCR POSITIVE (*)    All other components within normal limits  BASIC METABOLIC PANEL - Abnormal; Notable for the following components:   Glucose, Bld 212 (*)    Calcium 7.8 (*)    All other components within normal limits  CBC - Abnormal; Notable for the following components:   RBC 3.57 (*)    Hemoglobin 11.2 (*)    HCT 34.2 (*)    All other components within normal limits  URINALYSIS, ROUTINE W REFLEX MICROSCOPIC - Abnormal; Notable for the following components:   Color, Urine YELLOW (*)    APPearance HAZY (*)    Glucose, UA 50 (*)    Hgb urine dipstick MODERATE (*)    Leukocytes,Ua LARGE (*)    All other components within normal limits  URINE CULTURE  CBG MONITORING,  ED   Labs notable for mild hyperglycemia, patient is not fasting has a history of known prediabetes followed by PCP.  Mild hypocalcemia.  Mild anemia  EKG  ED ECG REPORT I, Sharyn Creamer, the attending physician, personally viewed and interpreted this ECG.  Date: 05/19/2023 EKG Time: 945 Rate: 55 Rhythm: normal sinus rhythm QRS Axis: normal Intervals: normal ST/T Wave abnormalities: normal Narrative Interpretation: no evidence of acute ischemia    RADIOLOGY  Chest x-ray interpreted by me as negative for acute finding   PROCEDURES:  Critical Care performed: No  Procedures   MEDICATIONS ORDERED IN ED: Medications  sodium chloride 0.9 % bolus 1,000 mL (0 mLs Intravenous Stopped 05/19/23 1315)     IMPRESSION / MDM / ASSESSMENT AND PLAN / ED COURSE  I reviewed the triage vital signs and the nursing notes.                              Differential diagnosis includes, but is not limited to, vasovagal, orthostatic, mild dehydration, probable COVID illness based on clinical history based off home test that she had.  She has no associated chest pain or shortness of breath.  Respiratory rate and vital signs reassuring.  She is nontoxic well-appearing.  Has a history of syncope multiple times with cardiac and neurologic evaluation including MRI, cardiology follow-ups, consider.  Her lab testing today was reassuring as well as her physical examination.  She has very mild anemia normal metabolic panel exception to slight hyperglycemia.  She is COVID-positive.  She is currently on treatment with Paxlovid Urinalysis shows large ketones but no nitrates and no bacteria are seen.  Some white blood cells but also squamous epithelial cells are present.  Sent for culture, no evidence of acute infection obvious by clinical assessment or patient symptomatology as he lacks dysuria urinary symptoms.  Patient's presentation is most consistent with acute complicated illness / injury requiring  diagnostic workup.   The patient is on the cardiac monitor to evaluate for evidence of arrhythmia and/or significant heart rate changes.   Negative for high risk sycnope via [facility] Syncope rule  After receiving IV fluid, observation in the ER patient is feeling well vital signs reassuring fully awake and alert.  Believe she is safe to continue home treatment for COVID and follow-up with PCP at  this point.  Careful return precautions discussed with patient all questions answered patient agreeable plan     FINAL CLINICAL IMPRESSION(S) / ED DIAGNOSES   Final diagnoses:  Syncope, unspecified syncope type  Dehydration  COVID-19     Rx / DC Orders   ED Discharge Orders     None        Note:  This document was prepared using Dragon voice recognition software and may include unintentional dictation errors.   Sharyn Creamer, MD 05/19/23 2013

## 2023-05-19 NOTE — ED Notes (Signed)
Pt asking to use restroom. Pt ambulated to toilet without issue, no assistance required.

## 2023-05-20 LAB — URINE CULTURE: Culture: <10000 — AB

## 2023-06-25 ENCOUNTER — Encounter: Payer: Self-pay | Admitting: *Deleted

## 2023-06-26 NOTE — Progress Notes (Unsigned)
Cardiology Office Note:  .   Date:  06/28/2023  ID:  Monica Woods, DOB 21-Nov-1955, MRN [id number] PCP: Lonie Peak, Cordelia Poche  [facility] Providers Cardiologist:  Bryan Lemma, MD     Chief Complaint  Patient presents with   Follow-up    No cardiac complaints at this time.   Loss of Consciousness    Recurrent episodes.    Patient Profile: .     Monica Woods is a 67 y.o. female with a PMH notable for HLD and pre-DM as well as history of vasovagal syncope and orthostatic dizziness who presents here for 56-month follow-up/hospital follow-up at the request of Lonie Peak, PA-C.  Monica Woods was seen for initial consult was back in October 2022 following an episode of syncope where she was reported to be bradycardic by EMS.  Prior to that she had been walking at least a mile a day without any difficulty.  More limited by OA related back and knee/hip pain.  Started having dizzy episodes in May and June 2022 with standing up and certain movements, however denied any syncope at that time.  During the episode of syncope, she had been out to dinner with her sister-in-law and felt queasy as though she needed to have a BM.  She then felt lightheaded and woozy with sensation of flushing and then had loss of consciousness upon arrival to the house.  She was normoglycemic to EMS evaluation.  Did not go to the ER but was apparently bradycardic on EKG. => Initial evaluation with monitor, echo and carotid Dopplers were all relatively benign.  There is no evidence of sustained bradycardia or bradycardia arrhythmia. => Recommended aggressive hydration including hydration supplement such as liquid IV or Powerade/Gatorade (low sugar).  Trying to avoid least 80 ounces a day along with adequate p.o. intake.  Also discussed avoiding triggers.   Monica Woods was last seen on December 08, 2021 for evaluation of bradycardia and syncope at the request of Lonie Peak, [place].Marland Kitchen  Not needed further episodes of  syncope just some occasional dizziness and weakness.  Mostly orthostatic.  Chronic dry mouth-indicated having to wear a mask for work.  Every now and then felt her heart rate go up but no syncopal episodes.  Mild positional dizziness.  Not exercising that much anymore because of fear of syncope.  Having bilateral knee pain and myalgias/cramps.  Environmental allergies.  Nervousness and insomnia.  No med changes made.  Again reiterated the importance of adequate hydration.  Also suggested compression stockings up the knee and foot elevation when able.  Subjective  Monica Woods Neurology Consult 09/28/2022 for syncope: Felt that the symptoms are most consistent with are static hypotension and did not recommend medications for hypertension.  Discussed slow transition from sitting to standing.  Recommend increasing hydration.  Recommended ibuprofen or meloxicam for right knee pain.  Cervical MRI ordered to assess for nerve impingement to assess right-sided neck pain and bilateral occipital nerve block scheduled for bilateral occipital neuralgia. Neurology follow-up 06/20/2023: Home BP recordings were borderline.  No vision loss.  Rare mild headaches.  No numbness or tingling.  3-hour EEG ordered and recommended referral to eye doctor to assess for papilledema. Recommended liberal hydration ER 05/19/2023 for syncope.  Per patient report got up and while standing started feeling lightheaded.  Also noted a few day history of feeling poorly with sore throat and runny nose.  Recently tested positive for COVID-19 by PCP and started on Paxlovid.  No fevers or chills.  No  nausea vomiting.  Has poor p.o. intake.  She does not exactly recall how she passed out.  She did recall the roughly 4 episodes of syncope in the previous year.  Once when standing and she fell and hit her head.  During this episode she was actually was able to recognize symptoms and got to the couch where she lay down and briefly "passed out".   Working on staying adequately hydrated. Treated with IV hydration.  Thought to be related to dehydration from COVID-19.  Discussed the use of AI scribe software for clinical note transcription with the patient, who gave verbal consent to proceed.  History of Present Illness   The patient, with a history of prediabetes, presented with recurrent episodes of syncope, approximately five to six times, since the previous year. The initial episode occurred in August of the previous year when the patient experienced dizziness upon exiting a vehicle and walking. Subsequent episodes were characterized by a preceding sensation of dizziness, followed by fainting. The most recent episode in September was different, with the patient reporting a sensation of feeling 'weird' rather than dizzy, experiencing a sensation of heat, and a strong neck pain post-fainting.  The patient noted that these episodes often occurred in the morning, after getting up and during breakfast preparation. On these occasions, the patient reported feeling weak upon waking. The patient denied any chest pain, shortness of breath, or irregular heartbeats before the fainting episodes. There was no correlation with the patient's work schedule as the fainting episodes occurred on days off work.  The patient had undergone multiple investigations, including blood tests, kidney function tests, and a brain MRI, all of which were reported as normal. A neurologist had been consulted, and further tests, including an eye examination and an EEG, were planned.  The patient had a positive COVID-19 test around the time of the most recent fainting episode in September. The patient was treated with an unspecified medication for COVID-19, which the patient's family suspected might have affected the heart rate and blood pressure.  The patient reported a history of a head injury from a fall at work several years ago. The patient also reported difficulty sleeping  and occasional use of melatonin and Benadryl for sleep. The patient's mother had a history of stroke.  The patient's work involves long hours and significant physical activity, with limited opportunities for hydration during the workday. The patient admitted to not drinking much water, even on days off work. The patient denied any significant alcohol or caffeine consumption.  The patient's blood pressure was noted to drop upon standing, without a compensatory increase in heart rate. The patient was not on any medications that could account for this.         Cardiovascular ROS: no chest pain or dyspnea on exertion positive for - loss of consciousness and dizziness and lightheadedness, mostly orthostatic. negative for - edema, irregular heartbeat, orthopnea, palpitations, paroxysmal nocturnal dyspnea, rapid heart rate, shortness of breath, or TIA or amaurosis fugax, claudication.  ROS:  Review of Systems - Negative except noted above.  With her syncopal episodes, no loss of bowel or bladder.  Usually associated with the fact that she is not eating and drinking well, most recent episode was associated with illness     Objective   Studies Reviewed: Marland Kitchen       Past CV ProcedureHistory:  Procedure Date   14-day Zio Patch Monitor 06/2019   Predominantly NSR: Rate range 45-118 bpm (minimum heart rate at 6 AM,  maximum heart rate 6:30 PM).  No sustained bradycardia., average 67 bpm.  Rare isolated PACs and PVCs. No arrhythmia either tachycardic or bradycardic: No atrial fibrillation, atrial flutter, sustained SVT or PAT (supraventricular or paroxysmal atrial tachycardia).   24-hour Holter Monitor 12/2014   Heart rate range 51-00 beats minute.  Average 67 bpm.  2 PVCs noted.  2 short bursts of SVT   STRESS ECHOCARDIOGRAM 12/2014   [facility]): Resting and stress EF > 55%.  Normal contraction.  Mild MR, mild TR.   TRANSTHORACIC ECHOCARDIOGRAM 07/20/2021   [facility]: EF 55 to 60%.   Normal wall motion.  Normal diastolic parameters.  Normal RV.  Normal aortic and mitral valve.  (Normal study)   05/09/2023-MRI brain: No acute IC abnormality.  Mild to moderate for age T2 FLAIR hyperintense signal changes within white cerebral matter.  Nonspecific.  Partially empty sella turcica.  (While this finding often reflects incidental anatomic variation may be associated with idiopathic intracranial hypertension-pseudotumor cerebri)  Lab Results  Component Value Date   NA 137 05/19/2023   K 4.1 05/19/2023   CREATININE 0.59 05/19/2023   GFRNONAA >60 05/19/2023   GLUCOSE 212 (H) 05/19/2023   No results found for: "HGBA1C" No results found for: "CHOL", "HDL", "LDLCALC", "LDLDIRECT", "TRIG", "CHOLHDL"   Risk Assessment/Calculations:        Physical Exam:   VS:  Ht 5\' 4"  (1.626 m)   Wt 132 lb 4.8 oz (60 kg)   LMP  (LMP Unknown)   SpO2 98%   BMI 22.71 kg/m    Orthostatic VS for the past 24 hrs (Last 3 readings):  BP- Lying Pulse- Lying BP- Sitting Pulse- Sitting BP- Standing at 0 minutes Pulse- Standing at 0 minutes BP- Standing at 3 minutes Pulse- Standing at 3 minutes  06/28/23 0821 132/66 66 130/70 61 126/60 60 120/58 61    Wt Readings from Last 3 Encounters:  06/28/23 132 lb 4.8 oz (60 kg)  07/25/22 137 lb (62.1 kg)  07/19/22 138 lb (62.6 kg)    GEN: Well nourished, well groomed, in no acute distress; healthy-appearing NECK: No JVD; No carotid bruits CARDIAC: Normal S1, S2; RRR, no murmurs, rubs, gallops RESPIRATORY:  Clear to auscultation without rales, wheezing or rhonchi ; nonlabored, good air movement. ABDOMEN: Soft, non-tender, non-distended EXTREMITIES:  No edema; No deformity     ASSESSMENT AND PLAN: .     Problem List Items Addressed This Visit       Cardiology Problems   Bradycardia, sinus - Primary    despite having wanted to monitor this of bradycardia on her event monitor.  Could consider loop recorder, will discuss with EP, but not likely be helpful  as I do not think this is the cause of her syncope.  I suspect that her bradycardia is probably related to vasovagal symptoms.      Relevant Orders   EKG 12-Lead (Completed)     Other   Orthostatic dizziness    Again stressed the importance of adequate nutrition and hydration as noted in syncope      Syncope and collapse (Chronic)    Recurrent episodes of fainting, with associated dizziness. No cardiac symptoms reported. Neurological workup including brain MRI and EEG were normal. Blood pressure drops significantly upon standing, suggesting possible orthostatic hypotension. No evidence of autonomic dysfunction from diabetes. -Increase fluid intake, aiming for 80 ounces of water daily, with electrolyte supplementation (e.g., Liquid IV, Propel). -Consider use of support socks to maintain blood volume. -  Consider use of Midodrine on an as-needed basis, particularly on days when feeling dizzy. -Check blood pressure regularly, particularly upon standing. -Consider referral to electrophysiologist for possible implantable monitor if syncope episodes continue.       General Health Maintenance -Ensure adequate nutrition, particularly in the morning. -Ensure adequate breaks at work for hydration and restroom use. Consider a letter to employer if necessary.  -  Ensure to hydrate with 80 oz of water daily (Propel, liquid IV). - Upon waking drink a bottle of water and something light such as fruit or protein bar - Ensure to properly hydrate at work during [employer]: Return in about 6 months (around 12/27/2023) for 6 month follow-up with me.  Total time spent: 40 min spent with patient + 23 min spent charting = 63 min    Signed, Marykay Lex, MD, MS Bryan Lemma, M.D., M.S. Interventional Cardiologist  [facility]  Pager # [phone] Phone # [phone] [address] [place], [place] [address]

## 2023-06-28 ENCOUNTER — Ambulatory Visit: Payer: No Typology Code available for payment source | Attending: Cardiology | Admitting: Cardiology

## 2023-06-28 ENCOUNTER — Encounter: Payer: Self-pay | Admitting: Cardiology

## 2023-06-28 VITALS — Ht 64.0 in | Wt 132.3 lb

## 2023-06-28 DIAGNOSIS — R55 Syncope and collapse: Secondary | ICD-10-CM

## 2023-06-28 DIAGNOSIS — R42 Dizziness and giddiness: Secondary | ICD-10-CM | POA: Diagnosis not present

## 2023-06-28 DIAGNOSIS — R001 Bradycardia, unspecified: Secondary | ICD-10-CM | POA: Diagnosis not present

## 2023-06-28 NOTE — Assessment & Plan Note (Signed)
despite having wanted to monitor this of bradycardia on her event monitor.  Could consider loop recorder, will discuss with EP, but not likely be helpful as I do not think this is the cause of her syncope.  I suspect that her bradycardia is probably related to vasovagal symptoms.

## 2023-06-28 NOTE — Patient Instructions (Addendum)
Medication Instructions:  Your physician recommends that you continue on your current medications as directed. Please refer to the Current Medication list given to you today.  *If you need a refill on your cardiac medications before your next appointment, please call your pharmacy*  Lab Work: - None ordered  Testing/Procedures: - None ordered  Follow-Up: At [facility], you and your health needs are our priority.  As part of our continuing mission to provide you with exceptional heart care, we have created designated Provider Care Teams.  These Care Teams include your primary Cardiologist (physician) and Advanced Practice Providers (APPs -  Physician Assistants and Nurse Practitioners) who all work together to provide you with the care you need, when you need it.  Your next appointment:   6 month(s)  Provider:   You may see Bryan Lemma, MD or one of the following Advanced Practice Providers on your designated Care Team:   Nicolasa Ducking, NP Eula Listen, PA-C Cadence Fransico Michael, PA-C Charlsie Quest, NP    Other Instructions -  Ensure to hydrate with 80 oz of water daily (Propel, liquid IV). - Upon waking drink a bottle of water and something light such as fruit or protein bar - Ensure to properly hydrate at work during breaks

## 2023-06-28 NOTE — Assessment & Plan Note (Signed)
Again stressed the importance of adequate nutrition and hydration as noted in syncope

## 2023-06-28 NOTE — Assessment & Plan Note (Signed)
Recurrent episodes of fainting, with associated dizziness. No cardiac symptoms reported. Neurological workup including brain MRI and EEG were normal. Blood pressure drops significantly upon standing, suggesting possible orthostatic hypotension. No evidence of autonomic dysfunction from diabetes. -Increase fluid intake, aiming for 80 ounces of water daily, with electrolyte supplementation (e.g., Liquid IV, Propel). -Consider use of support socks to maintain blood volume. -Consider use of Midodrine on an as-needed basis, particularly on days when feeling dizzy. -Check blood pressure regularly, particularly upon standing. -Consider referral to electrophysiologist for possible implantable monitor if syncope episodes continue.

## 2023-12-28 ENCOUNTER — Ambulatory Visit: Payer: No Typology Code available for payment source | Attending: Physician Assistant | Admitting: Physician Assistant

## 2023-12-28 NOTE — Progress Notes (Deleted)
 Cardiology Office Note    Date:  12/28/2023   ID:  Monica Woods, DOB Nov 05, 1955, MRN [id number]  PCP:  Aloha Arnold, PA-C  Cardiologist:  Randene Bustard, MD  Electrophysiologist:  None   Chief Complaint: ***  History of Present Illness:   Monica Woods is a 68 y.o. female with history of ***  ***   Labs independently reviewed: ***  Past Medical History:  Diagnosis Date   High cholesterol    Had been on Lovaza and Crestor, but she did not realize that she is will stay on these.  No longer taking them.   Pre-diabetes     Past Surgical History:  Procedure Laterality Date   14-day Zio Patch Monitor  06/2019   Predominantly NSR: Rate range 45-118 bpm (minimum heart rate at 6 AM, maximum heart rate 6:30 PM).  No sustained bradycardia., average 67 bpm.  Rare isolated PACs and PVCs. No arrhythmia either tachycardic or bradycardic: No atrial fibrillation, atrial flutter, sustained SVT or PAT (supraventricular or paroxysmal atrial tachycardia).   24-hour Holter Monitor  12/2014   Heart rate range 51-00 beats minute.  Average 67 bpm.  2 PVCs noted.  2 short bursts of SVT   BREAST BIOPSY Right 04/02/2014   neg stereo for FIBROADENOMATOUS CHANGES WITH SCLEROSIS AND STROMAL    COLONOSCOPY N/A 07/25/2022   Procedure: COLONOSCOPY;  Surgeon: Shane Darling, MD;  Location: [facility] ENDOSCOPY;  Service: Endoscopy;  Laterality: N/A;   STRESS ECHOCARDIOGRAM  12/2014   [facility]): Resting and stress EF > 55%.  Normal contraction.  Mild MR, mild TR.   TRANSTHORACIC ECHOCARDIOGRAM  07/20/2021   [facility]: EF 55 to 60%.  Normal wall motion.  Normal diastolic parameters.  Normal RV.  Normal aortic and mitral valve.  (Normal study)    Current Medications: No outpatient medications have been marked as taking for the 12/28/23 encounter (Appointment) with Roark Chick, PA-C.    Allergies:   Levonorgestrel-ethinyl estrad   Social History   Socioeconomic History    Marital status: Divorced    Spouse name: Not on file   Number of children: Not on file   Years of education: Not on file   Highest education level: Not on file  Occupational History   Not on file  Tobacco Use   Smoking status: Never   Smokeless tobacco: Never  Vaping Use   Vaping status: Never Used  Substance and Sexual Activity   Alcohol use: Never    Alcohol/week: 0.0 standard drinks of alcohol   Drug use: Never   Sexual activity: Not Currently  Other Topics Concern   Not on file  Social History Narrative   Not on file   Social Drivers of Health   Financial Resource Strain: Not on file  Food Insecurity: Not on file  Transportation Needs: Not on file  Physical Activity: Not on file  Stress: Not on file  Social Connections: Not on file     Family History:  The patient's family history includes Hypertension in her father and mother; Stroke in her mother. There is no history of Breast cancer.  ROS:   12-point review of systems is negative unless otherwise noted in the HPI.   EKGs/Labs/Other Studies Reviewed:    Studies reviewed were summarized above. The additional studies were reviewed today: ***  EKG:  EKG is ordered today.  The EKG ordered today demonstrates ***  Recent Labs: 05/19/2023: BUN 15; Creatinine, Ser 0.59; Hemoglobin 11.2; Platelets 156; Potassium  4.1; Sodium 137  Recent Lipid Panel No results found for: "CHOL", "TRIG", "HDL", "CHOLHDL", "VLDL", "LDLCALC", "LDLDIRECT"  PHYSICAL EXAM:    VS:  LMP  (LMP Unknown)   BMI: There is no height or weight on file to calculate BMI.  Physical Exam  Wt Readings from Last 3 Encounters:  06/28/23 132 lb 4.8 oz (60 kg)  07/25/22 137 lb (62.1 kg)  07/19/22 138 lb (62.6 kg)     ASSESSMENT & PLAN:   ***   {Are you ordering a CV Procedure (e.g. stress test, cath, DCCV, TEE, etc)?   Press F2        :[id number]}     Disposition: F/u with Dr. Aaron Aas or an APP in ***.   Medication Adjustments/Labs and  Tests Ordered: Current medicines are reviewed at length with the patient today.  Concerns regarding medicines are outlined above. Medication changes, Labs and Tests ordered today are summarized above and listed in the Patient Instructions accessible in Encounters.   Signed, Varney Gentleman, PA-C 12/28/2023 7:15 AM     [facility] [place] [address] [place], [place] [address] [phone]
